# Patient Record
Sex: Male | Born: 1965 | Race: Black or African American | Hispanic: No | Marital: Married | Smoking: Never smoker
Health system: Southern US, Community
[De-identification: ages and names within clinical notes are randomized; demographics above are authoritative.]

## PROBLEM LIST (undated history)

## (undated) DIAGNOSIS — E785 Hyperlipidemia, unspecified: Secondary | ICD-10-CM

## (undated) DIAGNOSIS — E559 Vitamin D deficiency, unspecified: Secondary | ICD-10-CM

## (undated) DIAGNOSIS — G4730 Sleep apnea, unspecified: Secondary | ICD-10-CM

## (undated) HISTORY — DX: Hyperlipidemia, unspecified: E78.5

## (undated) HISTORY — PX: TOOTH EXTRACTION: SUR596

## (undated) HISTORY — DX: Vitamin D deficiency, unspecified: E55.9

## (undated) HISTORY — PX: NO PAST SURGERIES: SHX2092

---

## 2010-03-05 ENCOUNTER — Ambulatory Visit: Payer: Self-pay | Admitting: Family Medicine

## 2013-10-08 LAB — HEMOGLOBIN A1C: HEMOGLOBIN A1C: 5.6 % (ref 4.0–6.0)

## 2013-10-08 LAB — BASIC METABOLIC PANEL
BUN: 14 mg/dL (ref 4–21)
Creatinine: 1.2 mg/dL (ref 0.6–1.3)
GLUCOSE: 95 mg/dL
Potassium: 5 mmol/L (ref 3.4–5.3)
Sodium: 143 mmol/L (ref 137–147)

## 2013-10-08 LAB — HEPATIC FUNCTION PANEL
ALT: 25 U/L (ref 10–40)
AST: 23 U/L (ref 14–40)

## 2013-10-08 LAB — LIPID PANEL
Cholesterol: 185 mg/dL (ref 0–200)
HDL: 43 mg/dL (ref 35–70)
LDL CALC: 122 mg/dL
Triglycerides: 99 mg/dL (ref 40–160)

## 2013-11-24 ENCOUNTER — Ambulatory Visit: Payer: Self-pay | Admitting: Family Medicine

## 2015-05-30 ENCOUNTER — Other Ambulatory Visit: Payer: Self-pay | Admitting: Family Medicine

## 2015-05-30 DIAGNOSIS — E559 Vitamin D deficiency, unspecified: Secondary | ICD-10-CM

## 2015-05-30 NOTE — Telephone Encounter (Signed)
Refill request

## 2015-05-30 NOTE — Telephone Encounter (Signed)
Let him know I would like to get a Vitamin D level first. Lab order is complete.

## 2015-05-30 NOTE — Telephone Encounter (Signed)
Pt called needing refill on his Vit D.  He use to see Dolores Frame.   His call back is [phone].  He uses [facility].  tp

## 2015-05-31 NOTE — Telephone Encounter (Signed)
Left message to call back  

## 2015-05-31 NOTE — Telephone Encounter (Signed)
Pt returned call. Thanks TNP °

## 2015-06-07 NOTE — Telephone Encounter (Signed)
Left message to call back  

## 2015-06-12 NOTE — Telephone Encounter (Signed)
Patient has been advised that it is time to have lab checked prior to refill. He states that he is out of town and will return Friday and plans on getting labs done then

## 2015-08-09 ENCOUNTER — Ambulatory Visit (INDEPENDENT_AMBULATORY_CARE_PROVIDER_SITE_OTHER): Payer: Managed Care, Other (non HMO) | Admitting: Family Medicine

## 2015-08-09 ENCOUNTER — Encounter: Payer: Self-pay | Admitting: Family Medicine

## 2015-08-09 VITALS — BP 112/70 | HR 78 | Temp 97.7°F | Resp 16 | Ht 71.0 in | Wt 189.0 lb

## 2015-08-09 DIAGNOSIS — J309 Allergic rhinitis, unspecified: Secondary | ICD-10-CM | POA: Insufficient documentation

## 2015-08-09 DIAGNOSIS — E559 Vitamin D deficiency, unspecified: Secondary | ICD-10-CM | POA: Diagnosis not present

## 2015-08-09 DIAGNOSIS — M722 Plantar fascial fibromatosis: Secondary | ICD-10-CM

## 2015-08-09 DIAGNOSIS — E7800 Pure hypercholesterolemia, unspecified: Secondary | ICD-10-CM

## 2015-08-09 DIAGNOSIS — Z131 Encounter for screening for diabetes mellitus: Secondary | ICD-10-CM | POA: Diagnosis not present

## 2015-08-09 DIAGNOSIS — N529 Male erectile dysfunction, unspecified: Secondary | ICD-10-CM | POA: Insufficient documentation
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
## 2015-08-09 NOTE — Patient Instructions (Signed)
We will call you with lab results and with your podiatry referral

## 2015-08-15 ENCOUNTER — Telehealth: Payer: Self-pay

## 2015-08-15 LAB — COMPREHENSIVE METABOLIC PANEL
ALBUMIN: 4.8 g/dL (ref 3.5–5.5)
ALK PHOS: 78 IU/L (ref 39–117)
ALT: 17 IU/L (ref 0–44)
AST: 19 IU/L (ref 0–40)
Albumin/Globulin Ratio: 2 (ref 1.1–2.5)
BILIRUBIN TOTAL: 1.3 mg/dL — AB (ref 0.0–1.2)
BUN / CREAT RATIO: 11 (ref 9–20)
BUN: 17 mg/dL (ref 6–24)
CHLORIDE: 100 mmol/L (ref 97–108)
CO2: 26 mmol/L (ref 18–29)
Calcium: 9.8 mg/dL (ref 8.7–10.2)
Creatinine, Ser: 1.48 mg/dL — ABNORMAL HIGH (ref 0.76–1.27)
GFR calc Af Amer: 64 mL/min/{1.73_m2} (ref >59)
GFR calc non Af Amer: 56 mL/min/{1.73_m2} — ABNORMAL LOW (ref >59)
GLUCOSE: 103 mg/dL — AB (ref 65–99)
Globulin, Total: 2.4 g/dL (ref 1.5–4.5)
POTASSIUM: 5 mmol/L (ref 3.5–5.2)
SODIUM: 142 mmol/L (ref 134–144)
Total Protein: 7.2 g/dL (ref 6.0–8.5)

## 2015-08-15 LAB — LIPID PANEL
CHOLESTEROL TOTAL: 222 mg/dL — AB (ref 100–199)
Chol/HDL Ratio: 4.6 ratio units (ref 0.0–5.0)
HDL: 48 mg/dL (ref >39)
LDL Calculated: 152 mg/dL — ABNORMAL HIGH (ref 0–99)
Triglycerides: 111 mg/dL (ref 0–149)
VLDL CHOLESTEROL CAL: 22 mg/dL (ref 5–40)

## 2015-08-15 LAB — VITAMIN D 25 HYDROXY (VIT D DEFICIENCY, FRACTURES): Vit D, 25-Hydroxy: 20.4 ng/mL — ABNORMAL LOW (ref 30.0–100.0)

## 2015-08-15 NOTE — Telephone Encounter (Signed)
Patient was advised of lab report he states that he is concerned about his kidney function declining and wants to know what could be the cause of this?

## 2015-08-15 NOTE — Telephone Encounter (Signed)
-----   Message from Carmon Ginsberg, [place] sent at 08/15/2015  8:07 AM EDT ----- Your cholesterol is mildly elevated but your 10 year risk for developing cardiovascular disease is calculated to be low at 3.8%. Your kidney function has declined since prior labs and I would like to repeat them in 3 months. Vitamin D is mildly depleted and would respond to 800 units daily over the counter. Sugar is creeping up and will repeat that in 3 months as well. Encourage regular exercise 30 minutes + daily.

## 2015-08-15 NOTE — Telephone Encounter (Signed)
Patient has been advised. KW 

## 2015-08-15 NOTE — Telephone Encounter (Signed)
Kidney function declines with aging. Also on a given day decreased fluid intake or medication may play a part on reducing function. We repeat labs to see if there is a trend up, down, or no change in function.

## 2017-03-06 ENCOUNTER — Encounter: Payer: Self-pay | Admitting: Family Medicine

## 2017-03-06 ENCOUNTER — Ambulatory Visit (INDEPENDENT_AMBULATORY_CARE_PROVIDER_SITE_OTHER): Payer: Managed Care, Other (non HMO) | Admitting: Family Medicine

## 2017-03-06 VITALS — BP 120/82 | HR 60 | Temp 97.9°F | Resp 16 | Wt 200.0 lb

## 2017-03-06 DIAGNOSIS — E559 Vitamin D deficiency, unspecified: Secondary | ICD-10-CM

## 2017-03-06 DIAGNOSIS — E7800 Pure hypercholesterolemia, unspecified: Secondary | ICD-10-CM | POA: Diagnosis not present

## 2017-03-06 DIAGNOSIS — R51 Headache: Secondary | ICD-10-CM | POA: Diagnosis not present

## 2017-03-06 DIAGNOSIS — N529 Male erectile dysfunction, unspecified: Secondary | ICD-10-CM

## 2017-03-06 DIAGNOSIS — R0683 Snoring: Secondary | ICD-10-CM

## 2017-03-06 DIAGNOSIS — R519 Headache, unspecified: Secondary | ICD-10-CM

## 2017-03-06 NOTE — Patient Instructions (Signed)
We will call you with the lab work. Proceed with eye exam as planned. Return  Epworth screen when completed.

## 2017-03-06 NOTE — Progress Notes (Signed)
Subjective:     Patient ID: Douglas Coffey, male   DOB: 1966/01/02, 51 y.o.   MRN: [id number]  HPI  Chief Complaint  Patient presents with  . Headache    Patient comes in office today to address frequent headaches he has been having over the past several months, patient reports that he has been under stress but does not feel that it is related. Patient states that he takes otc Advil and Aleve for pain relief.   . Abnormal Lab    Patient would like to discuss getting his annual blood work screeening today.Last time labs were checked was 08/09/15, cholesterol was elevated and patients kidney funtion seemed to be declining. At patients last office visit it was advised that he follow up in 3 months for repeat labs, patient was also advised to start Vit D 800iu for vitamin d deficiency.   States  He has throbbing 7/10 headaches every other week for the last couple of months. No personal or family hx of migraines. Denies work stress. Usually will respond to otc nsaid's and/or a nap. States he looks at monitors during his workday and may not wear his glasses as often as he should. He is pending eye exam. Caffeine use is one cup/day. Reports low energy and sub-optimal erectile dysfunction though libido is intact. He has had plantar fasciitis and has not been exercising regularly with weight gain of 11# since 08/09/2015 o.v.   Review of Systems  Respiratory: Negative for shortness of breath.   Cardiovascular: Negative for chest pain and palpitations.  Genitourinary:       Nocturia x one       Objective:   Physical Exam  Constitutional: He appears well-developed. No distress.  Eyes: EOM are normal. Pupils are equal, round, and reactive to light.  Neck:  No carotid bruits  Cardiovascular: Normal rate and regular rhythm.   Pulmonary/Chest: Breath sounds normal.       Assessment:    1. Pure hypercholesterolemia - Lipid panel  2. Vitamin D deficiency - VITAMIN D 25 Hydroxy (Vit-D Deficiency,  Fractures)  3. Erectile dysfunction, unspecified erectile dysfunction type  4. Headache disorder - Comprehensive metabolic panel - T4, free - TSH  5. Snoring: provided with Epworth sreen    Plan:    Proceed with eye exam. Further f/u pending labwork and Epworth screen. Consider Viagra.

## 2017-03-07 ENCOUNTER — Other Ambulatory Visit: Payer: Self-pay | Admitting: Family Medicine

## 2017-03-07 LAB — LIPID PANEL
Chol/HDL Ratio: 4.8 ratio (ref 0.0–5.0)
Cholesterol, Total: 222 mg/dL — ABNORMAL HIGH (ref 100–199)
HDL: 46 mg/dL (ref >39)
LDL Calculated: 152 mg/dL — ABNORMAL HIGH (ref 0–99)
Triglycerides: 122 mg/dL (ref 0–149)
VLDL Cholesterol Cal: 24 mg/dL (ref 5–40)

## 2017-03-07 LAB — COMPREHENSIVE METABOLIC PANEL
A/G RATIO: 2 (ref 1.2–2.2)
ALBUMIN: 4.9 g/dL (ref 3.5–5.5)
ALT: 17 IU/L (ref 0–44)
AST: 18 IU/L (ref 0–40)
Alkaline Phosphatase: 72 IU/L (ref 39–117)
BILIRUBIN TOTAL: 1.1 mg/dL (ref 0.0–1.2)
BUN / CREAT RATIO: 13 (ref 9–20)
BUN: 17 mg/dL (ref 6–24)
CHLORIDE: 97 mmol/L (ref 96–106)
CO2: 26 mmol/L (ref 18–29)
Calcium: 9.1 mg/dL (ref 8.7–10.2)
Creatinine, Ser: 1.34 mg/dL — ABNORMAL HIGH (ref 0.76–1.27)
GFR calc non Af Amer: 62 mL/min/{1.73_m2} (ref >59)
GFR, EST AFRICAN AMERICAN: 72 mL/min/{1.73_m2} (ref >59)
GLOBULIN, TOTAL: 2.5 g/dL (ref 1.5–4.5)
GLUCOSE: 76 mg/dL (ref 65–99)
POTASSIUM: 4.7 mmol/L (ref 3.5–5.2)
SODIUM: 142 mmol/L (ref 134–144)
Total Protein: 7.4 g/dL (ref 6.0–8.5)

## 2017-03-07 LAB — T4, FREE: FREE T4: 1.27 ng/dL (ref 0.82–1.77)

## 2017-03-07 LAB — TSH: TSH: 3.9 u[IU]/mL (ref 0.450–4.500)

## 2017-03-07 LAB — VITAMIN D 25 HYDROXY (VIT D DEFICIENCY, FRACTURES): VIT D 25 HYDROXY: 30.7 ng/mL (ref 30.0–100.0)

## 2017-03-07 MED ORDER — SILDENAFIL CITRATE 50 MG PO TABS: 50.0000 mg | ORAL_TABLET | Freq: Every day | ORAL | 5 refills | Status: DC | PRN

## 2017-06-03 ENCOUNTER — Encounter: Payer: Self-pay | Admitting: Family Medicine

## 2017-06-03 ENCOUNTER — Ambulatory Visit (INDEPENDENT_AMBULATORY_CARE_PROVIDER_SITE_OTHER): Payer: Managed Care, Other (non HMO) | Admitting: Family Medicine

## 2017-06-03 VITALS — BP 110/80 | HR 74 | Temp 98.6°F | Resp 16 | Wt 200.8 lb

## 2017-06-03 DIAGNOSIS — R0683 Snoring: Secondary | ICD-10-CM

## 2017-06-03 DIAGNOSIS — R040 Epistaxis: Secondary | ICD-10-CM | POA: Diagnosis not present

## 2017-06-03 DIAGNOSIS — G43009 Migraine without aura, not intractable, without status migrainosus: Secondary | ICD-10-CM | POA: Diagnosis not present

## 2017-06-03 MED ORDER — NORTRIPTYLINE HCL 10 MG PO CAPS: ORAL_CAPSULE | ORAL | 0 refills | Status: DC

## 2017-06-03 NOTE — Patient Instructions (Signed)
Please complete Epworth/sleep apnea screen and return. Try daily use of saline nasal spray. After you have a nose bleed use Afrin or similar once after a nose bleed. If not improving call for ENT consult. Migraine Headache A migraine headache is a very strong throbbing pain on one side or both sides of your head. Migraines can also cause other symptoms. Talk with your doctor about what things may bring on (trigger) your migraine headaches. Follow these instructions at home: Medicines  Take over-the-counter and prescription medicines only as told by your doctor.  Do not drive or use heavy machinery while taking prescription pain medicine.  To prevent or treat constipation while you are taking prescription pain medicine, your doctor may recommend that you: ? Drink enough fluid to keep your pee (urine) clear or pale yellow. ? Take over-the-counter or prescription medicines. ? Eat foods that are high in fiber. These include fresh fruits and vegetables, whole grains, and beans. ? Limit foods that are high in fat and processed sugars. These include fried and sweet foods. Lifestyle  Avoid alcohol.  Do not use any products that contain nicotine or tobacco, such as cigarettes and e-cigarettes. If you need help quitting, ask your doctor.  Get at least 8 hours of sleep every night.  Limit your stress. General instructions   Keep a journal to find out what may bring on your migraines. For example, write down: ? What you eat and drink. ? How much sleep you get. ? Any change in what you eat or drink. ? Any change in your medicines.  If you have a migraine: ? Avoid things that make your symptoms worse, such as bright lights. ? It may help to lie down in a dark, quiet room. ? Do not drive or use heavy machinery. ? Ask your doctor what activities are safe for you.  Keep all follow-up visits as told by your doctor. This is important. Contact a doctor if:  You get a migraine that is different  or worse than your usual migraines. Get help right away if:  Your migraine gets very bad.  You have a fever.  You have a stiff neck.  You have trouble seeing.  Your muscles feel weak or like you cannot control them.  You start to lose your balance a lot.  You start to have trouble walking.  You pass out (faint). This information is not intended to replace advice given to you by your health care provider. Make sure you discuss any questions you have with your health care provider. Document Released: 08/27/2008 Document Revised: 06/07/2016 Document Reviewed: 05/06/2016 Elsevier Interactive Patient Education  2017 [employer].

## 2017-06-03 NOTE — Progress Notes (Signed)
Subjective:     Patient ID: Douglas Coffey, male   DOB: [birth year], 51 y.o.   MRN: [id number]  HPI  Chief Complaint  Patient presents with  . Headache    Patient comes in office today with complaints of intermittent headaches for the past 3 weeks. Patient states that he has pain on the top sides of his head that he describes as a throbbing feeling. Patient states that he takes Tylenol or Advil when at work when headaches occurs.   . Epistaxis    Patient reports episodes of frequent nose bleeds over the past few weeks. Patient states that he bleeds from the right notstril and usual packs it with tissue and bleeding subsides in a matter of seconds.   Seen previously for headaches in April. He updated his eye exam at that time with change of prescription. States he is getting 3-4/week and will wake up with them at times. Describes as throbbing with mild photophobia, pain intensity up to 8-9/10. Denies work or marital stress and is unsure what triggers them. He is currently undergoing chiropractic treatment for a "pinched nerve" in his neck. Localizes pain to the posterior base of his neck. Regarding nose bleeds states he does work in his yard and is exposed to heat and dust. Reports either nostril can bleed. No other bleeding sites reported.   Review of Systems  Respiratory:       Will have him repeat he Epworth screen due to headaches. Prior score in April of 11.       Objective:   Physical Exam  Constitutional: He appears well-developed and well-nourished. No distress.  HENT:  Nostrils without bleeding sites or polyps noted.  Eyes: EOM are normal. Pupils are equal, round, and reactive to light.  Musculoskeletal:  Cervical FROM with increased posterior neck pain with flexion and right lateral movement  Neurological: Coordination (Finger to Nose WNL, Romberg negative) normal.       Assessment:    1. Epistaxis, recurrent  2. Migraine without aura and without status migrainosus, not  intractable - nortriptyline (PAMELOR) 10 MG capsule; One to three at bedtime  Dispense: 30 capsule; Refill: 0  3. Snoring: provided with Epworth screen    Plan:    Discussed use of saline spray for humidification and nasal decongestant spray immediately after a nose bleed. He will return Epworth screen and let me know if nortriptyline tolerated.

## 2017-06-05 ENCOUNTER — Other Ambulatory Visit: Payer: Self-pay | Admitting: Family Medicine

## 2017-06-05 DIAGNOSIS — R0683 Snoring: Secondary | ICD-10-CM

## 2017-06-10 ENCOUNTER — Encounter: Payer: Self-pay | Admitting: Family Medicine

## 2017-10-02 ENCOUNTER — Telehealth: Payer: Self-pay | Admitting: Family Medicine

## 2017-10-02 NOTE — Telephone Encounter (Signed)
Pt states insurance will only cover an at home sleep study.  Pt is requesting a referral to have his sleep study done at home.  UK#[phone]/[id number]

## 2017-10-03 NOTE — Telephone Encounter (Signed)
Douglas Coffey, you should have received a request for home sleep study.Let me know what else I need to do

## 2017-10-03 NOTE — Telephone Encounter (Signed)
Please review referral.KW

## 2017-10-07 NOTE — Telephone Encounter (Signed)
Pt returned call and was advised that order was faxed. Thanks TNP

## 2017-10-07 NOTE — Telephone Encounter (Signed)
Order for home sleep study faxed to [facility].Marland KitchenLeft message or pt to return call to advise

## 2017-10-24 ENCOUNTER — Other Ambulatory Visit: Payer: Self-pay | Admitting: Family Medicine

## 2017-10-24 DIAGNOSIS — G4733 Obstructive sleep apnea (adult) (pediatric): Secondary | ICD-10-CM

## 2017-10-29 ENCOUNTER — Encounter: Payer: Self-pay | Admitting: Family Medicine

## 2017-12-25 ENCOUNTER — Encounter: Payer: Self-pay | Admitting: Family Medicine

## 2017-12-25 ENCOUNTER — Ambulatory Visit: Payer: Managed Care, Other (non HMO) | Admitting: Family Medicine

## 2017-12-25 VITALS — BP 110/80 | HR 71 | Temp 97.9°F | Resp 16 | Wt 206.4 lb

## 2017-12-25 DIAGNOSIS — G4733 Obstructive sleep apnea (adult) (pediatric): Secondary | ICD-10-CM | POA: Insufficient documentation

## 2017-12-25 DIAGNOSIS — J0100 Acute maxillary sinusitis, unspecified: Secondary | ICD-10-CM

## 2017-12-25 MED ORDER — AMOXICILLIN-POT CLAVULANATE 875-125 MG PO TABS: 1.0000 | ORAL_TABLET | Freq: Two times a day (BID) | ORAL | 0 refills | Status: DC

## 2017-12-25 NOTE — Patient Instructions (Signed)
Discussed use of Mucinex and Sudafed for congestion, Delsym for cough, and Benadryl for postnasal drainage. Before flying use a nasal decongestant spray like Afrin.

## 2017-12-25 NOTE — Progress Notes (Signed)
Subjective:     Patient ID: Douglas Coffey, male   DOB: [birth year], 52 y.o.   MRN: [id number] Chief Complaint  Patient presents with  . Sinus Problem    Patient comes in office today with concerns of sinus pain and pressure for one week. Patient reports the following; cough, sore throat, sneezing and watery eyes. Patient has tried otc Theraflu, Sudafed and Mucinex.    HPI Patient reports increased sinus pressure, purulent sinus drainage, post nasal drainage and accompanying cough. States he is [place] for [place] in two days.  Review of Systems     Objective:   Physical Exam  Constitutional: He appears well-developed and well-nourished. No distress.  Ears: T.M's intact without inflammation Sinuses: mild maxillary sinus tenderness Throat: no tonsillar enlargement or exudate Neck: no cervical adenopathy Lungs: clear     Assessment:    1. Acute maxillary sinusitis, recurrence not specified - amoxicillin-clavulanate (AUGMENTIN) 875-125 MG tablet; Take 1 tablet by mouth 2 (two) times daily.  Dispense: 20 tablet; Refill: 0    Plan:    Discussed otc medication and use of nasal decongestant spray before flying.

## 2018-01-05 ENCOUNTER — Other Ambulatory Visit: Payer: Self-pay | Admitting: Family Medicine

## 2018-01-05 DIAGNOSIS — G4733 Obstructive sleep apnea (adult) (pediatric): Secondary | ICD-10-CM

## 2019-01-07 ENCOUNTER — Ambulatory Visit: Payer: Managed Care, Other (non HMO) | Admitting: Family Medicine

## 2019-01-07 ENCOUNTER — Encounter: Payer: Self-pay | Admitting: Family Medicine

## 2019-01-07 ENCOUNTER — Other Ambulatory Visit: Payer: Self-pay | Admitting: Family Medicine

## 2019-01-07 ENCOUNTER — Other Ambulatory Visit: Payer: Self-pay

## 2019-01-07 VITALS — BP 114/80 | HR 67 | Temp 97.7°F | Ht 71.0 in | Wt 208.0 lb

## 2019-01-07 DIAGNOSIS — N529 Male erectile dysfunction, unspecified: Secondary | ICD-10-CM

## 2019-01-07 DIAGNOSIS — E7800 Pure hypercholesterolemia, unspecified: Secondary | ICD-10-CM

## 2019-01-07 DIAGNOSIS — E559 Vitamin D deficiency, unspecified: Secondary | ICD-10-CM

## 2019-01-07 DIAGNOSIS — Z131 Encounter for screening for diabetes mellitus: Secondary | ICD-10-CM

## 2019-01-07 DIAGNOSIS — Z1211 Encounter for screening for malignant neoplasm of colon: Secondary | ICD-10-CM

## 2019-01-07 MED ORDER — SILDENAFIL CITRATE 100 MG PO TABS: 100.0000 mg | ORAL_TABLET | Freq: Every day | ORAL | 2 refills | Status: DC | PRN

## 2019-01-07 NOTE — Progress Notes (Signed)
  Subjective:     Patient ID: Douglas Coffey, male   DOB: [birth year], 53 y.o.   MRN: [id number] Chief Complaint  Patient presents with  . Erectile Dysfunction    labs requested for PSA, and other labs  . referal request    urology   HPI States he has had friends diagnosed with prostate cancer. Also states he never filled prior rx for Viagra due to cost at that time. States if Viagra helps his erections and stamina he won't need a urology referral. Is willing to get other age appropriate screening. Remains on C-Pap for OSA.  Review of Systems  Respiratory: Negative for shortness of breath.   Cardiovascular: Negative for chest pain and palpitations.  Genitourinary:       Nocturia 0-1       Objective:   Physical Exam Constitutional:      General: He is not in acute distress. Cardiovascular:     Rate and Rhythm: Normal rate and regular rhythm.  Pulmonary:     Breath sounds: Normal breath sounds.  Musculoskeletal:     Right lower leg: No edema.     Left lower leg: No edema.  Neurological:     Mental Status: He is alert.        Assessment:    1. Erectile dysfunction, unspecified erectile dysfunction type - PSA  2. Pure hypercholesterolemia - Lipid panel  3. Screening for diabetes mellitus - Comprehensive metabolic panel  4. Vitamin D deficiency - VITAMIN D 25 Hydroxy (Vit-D Deficiency, Fractures)  5. Screen for colon cancer - Ambulatory referral to Gastroenterology    Plan:    Further f/u pending lab work. He will let us know about efficacy of the Viagra.

## 2019-01-07 NOTE — Patient Instructions (Signed)
We will call you with the lab results. 

## 2019-01-08 LAB — LIPID PANEL
Chol/HDL Ratio: 5.1 ratio — ABNORMAL HIGH (ref 0.0–5.0)
Cholesterol, Total: 235 mg/dL — ABNORMAL HIGH (ref 100–199)
HDL: 46 mg/dL (ref >39)
LDL Calculated: 166 mg/dL — ABNORMAL HIGH (ref 0–99)
Triglycerides: 114 mg/dL (ref 0–149)
VLDL Cholesterol Cal: 23 mg/dL (ref 5–40)

## 2019-01-08 LAB — COMPREHENSIVE METABOLIC PANEL
ALBUMIN: 4.6 g/dL (ref 4.0–5.0)
ALT: 22 IU/L (ref 0–44)
AST: 18 IU/L (ref 0–40)
Albumin/Globulin Ratio: 2 (ref 1.2–2.2)
Alkaline Phosphatase: 77 IU/L (ref 39–117)
BUN/Creatinine Ratio: 10 (ref 9–20)
BUN: 13 mg/dL (ref 6–24)
Bilirubin Total: 1 mg/dL (ref 0.0–1.2)
CO2: 26 mmol/L (ref 20–29)
Calcium: 9.3 mg/dL (ref 8.7–10.2)
Chloride: 102 mmol/L (ref 96–106)
Creatinine, Ser: 1.28 mg/dL — ABNORMAL HIGH (ref 0.76–1.27)
GFR calc Af Amer: 75 mL/min/{1.73_m2} (ref >59)
GFR calc non Af Amer: 65 mL/min/{1.73_m2} (ref >59)
GLOBULIN, TOTAL: 2.3 g/dL (ref 1.5–4.5)
Glucose: 84 mg/dL (ref 65–99)
Potassium: 4.7 mmol/L (ref 3.5–5.2)
SODIUM: 144 mmol/L (ref 134–144)
Total Protein: 6.9 g/dL (ref 6.0–8.5)

## 2019-01-08 LAB — VITAMIN D 25 HYDROXY (VIT D DEFICIENCY, FRACTURES): Vit D, 25-Hydroxy: 23.6 ng/mL — ABNORMAL LOW (ref 30.0–100.0)

## 2019-01-08 LAB — PSA: Prostate Specific Ag, Serum: 0.9 ng/mL (ref 0.0–4.0)

## 2019-01-18 ENCOUNTER — Encounter: Payer: Self-pay | Admitting: *Deleted

## 2019-09-08 ENCOUNTER — Other Ambulatory Visit: Payer: Self-pay

## 2019-09-08 ENCOUNTER — Ambulatory Visit: Payer: Managed Care, Other (non HMO) | Admitting: Physician Assistant

## 2019-09-08 ENCOUNTER — Encounter: Payer: Self-pay | Admitting: Physician Assistant

## 2019-09-08 VITALS — BP 125/87 | HR 74 | Temp 97.2°F | Resp 16 | Ht 71.0 in | Wt 197.2 lb

## 2019-09-08 DIAGNOSIS — N529 Male erectile dysfunction, unspecified: Secondary | ICD-10-CM

## 2019-09-08 DIAGNOSIS — Z1211 Encounter for screening for malignant neoplasm of colon: Secondary | ICD-10-CM | POA: Diagnosis not present

## 2019-09-08 DIAGNOSIS — R251 Tremor, unspecified: Secondary | ICD-10-CM | POA: Diagnosis not present

## 2019-09-08 NOTE — Patient Instructions (Signed)

## 2019-09-08 NOTE — Progress Notes (Signed)
Patient: Douglas Coffey Male    DOB: May 31, 1966   53 y.o.   MRN: [id number] Visit Date: 09/08/2019  Today's Provider: Trinna Post, PA-C   Chief Complaint  Patient presents with  . hand  shaking   Subjective:    I,Joseline E. Rosas,RMA am acting as a Education administrator for [employer], PA-C.  HPI  Patient here with c/o noticed when eating his hand shaking a little, right hand. He has noticed this for a couple months. Doesn't happen all the time, happens sporadically. Worse when he eats. Drinks decaffeinated tea. Takes supplements including gingko biloba, tumeric, cinnamon, L-carnitine, multivitamin, ashwadhanda, vitamin B12 and vitamin D. Wondering if any of these could interfere.   Referred for colonoscopy 01/2019. Has not yet scheduled due to [employer].   Patient taking Viagra for erectile dysfunction. Wondering if there is any cheaper alternative.    Patient Declined Influenza vaccine.  No Known Allergies   Current Outpatient Medications:  .  cetirizine (ZYRTEC) 10 MG tablet, Take 10 mg by mouth daily., Disp: , Rfl:  .  Multiple Vitamin (MULTIVITAMIN) tablet, Take 1 tablet by mouth daily., Disp: , Rfl:  .  Omega-3 Fatty Acids (FISH OIL) 1200 MG CAPS, Take 2 capsules by mouth daily., Disp: , Rfl:  .  psyllium (METAMUCIL) 58.6 % powder, Take 1 packet by mouth 2 (two) times daily., Disp: , Rfl:  .  sildenafil (VIAGRA) 100 MG tablet, Take 1 tablet (100 mg total) by mouth daily as needed for erectile dysfunction., Disp: 10 tablet, Rfl: 2 .  TURMERIC PO, Take by mouth., Disp: , Rfl:  .  montelukast (SINGULAIR) 10 MG tablet, Take 10 mg by mouth at bedtime., Disp: , Rfl:  .  nortriptyline (PAMELOR) 10 MG capsule, One to three at bedtime (Patient not taking: Reported on 01/07/2019), Disp: 30 capsule, Rfl: 0  Review of Systems  Social History   Tobacco Use  . Smoking status: Never Smoker  . Smokeless tobacco: Never Used  Substance Use Topics  . Alcohol use: No    Alcohol/week: 0.0  standard drinks      Objective:   BP 125/87 (BP Location: Left Arm, Patient Position: Sitting, Cuff Size: Large)   Pulse 74   Temp (!) 97.2 F (36.2 C) (Other (Comment))   Resp 16   Ht 5\' 11"  (1.803 m)   Wt 197 lb 3.2 oz (89.4 kg)   BMI 27.50 kg/m  Vitals:   09/08/19 0947  BP: 125/87  Pulse: 74  Resp: 16  Temp: (!) 97.2 F (36.2 C)  TempSrc: Other (Comment)  Weight: 197 lb 3.2 oz (89.4 kg)  Height: 5\' 11"  (1.803 m)  Body mass index is 27.5 kg/m.   Physical Exam Constitutional:      Appearance: Normal appearance.  Eyes:     Extraocular Movements: Extraocular movements intact.     Pupils: Pupils are equal, round, and reactive to light.  Skin:    General: Skin is warm and dry.  Neurological:     Mental Status: He is alert and oriented to person, place, and time. Mental status is at baseline.     Motor: Tremor present.     Comments: Slight tremor in hands bilaterally, right > left, when held against gravity. Slightly worse with goal directed activity. 5/5 bilateral upper extremity strength.   Psychiatric:        Mood and Affect: Mood normal.        Behavior: Behavior normal.  No results found for any visits on 09/08/19.     Assessment & Plan    1. Tremor  Symptoms consistent with essential tremor. Mild tremor on exam today. Reviewed that it may progress. Reviewed treatments including propranolol. He does not want to start treatment today as he feels symptoms are mild but he may consider this in the future. Counseled on risks of some of his supplements including bleeding with gingko biloba, decreased iron absorption with tumeric and hyperthryoidism with ashwaghanda.   2. Colon cancer screening  Encouraged him to schedule colonoscopy with [facility].   3. Erectile dysfunction, unspecified erectile dysfunction type  Counseled on calling pharmacies to compare prices. Counseled on [facility] mail order pharmacy in [place].   The entirety of the  information documented in the History of Present Illness, Review of Systems and Physical Exam were personally obtained by me. Portions of this information were initially documented by Lyndel Pleasure, CMA and reviewed by me for thoroughness and accuracy.   F/u PRN        Trinna Post, PA-C  [facility]

## 2020-02-11 ENCOUNTER — Ambulatory Visit: Payer: Self-pay | Attending: Internal Medicine

## 2020-02-11 DIAGNOSIS — Z23 Encounter for immunization: Secondary | ICD-10-CM

## 2020-02-11 NOTE — Progress Notes (Signed)
   Covid-19 Vaccination Clinic  Name:  Douglas Coffey    MRN: [id number] DOB: 1966-06-27  02/11/2020  [name] was observed post Covid-19 immunization for 15 minutes without incident. He was provided with Vaccine Information Sheet and instruction to access the V-Safe system.   [name] was instructed to call 911 with any severe reactions post vaccine: Marland Kitchen Difficulty breathing  . Swelling of face and throat  . A fast heartbeat  . A bad rash all over body  . Dizziness and weakness   Immunizations Administered    Name Date Dose VIS Date Route   Moderna COVID-19 Vaccine 02/11/2020 11:37 AM 0.5 mL 11/02/2019 Intramuscular   Manufacturer: Moderna   Lot: [id number]   [id number]

## 2020-03-14 ENCOUNTER — Ambulatory Visit: Payer: Self-pay | Attending: Internal Medicine

## 2020-03-14 DIAGNOSIS — Z23 Encounter for immunization: Secondary | ICD-10-CM

## 2020-03-14 NOTE — Progress Notes (Signed)
   Covid-19 Vaccination Clinic  Name:  Percie Campisano    MRN: [id number] DOB: 1966-03-08  03/14/2020  Mr. Thalheimer was observed post Covid-19 immunization for 15 minutes without incident. He was provided with Vaccine Information Sheet and instruction to access the V-Safe system.   Mr. Simich was instructed to call 911 with any severe reactions post vaccine: Marland Kitchen Difficulty breathing  . Swelling of face and throat  . A fast heartbeat  . A bad rash all over body  . Dizziness and weakness   Immunizations Administered    Name Date Dose VIS Date Route   Moderna COVID-19 Vaccine 03/14/2020 11:28 AM 0.5 mL 11/02/2019 Intramuscular   Manufacturer: Moderna   Lot: [id number]   [id number]

## 2020-05-17 NOTE — Progress Notes (Signed)
Established patient visit   Patient: Douglas Coffey   DOB: 12/20/1965   54 y.o. Male  MRN: [id number] Visit Date: 05/18/2020  Today's healthcare provider: Mar Daring, PA-C   Chief Complaint  Patient presents with  . Tremors  . Tinnitus  . Erectile Dysfunction   Subjective    HPI Follow up for Tremors  The patient was last seen for this 6 months ago. Changes made at last visit include no changes.  He reports no oral medications. Patient reports tremor is more noticeable on right hand. He feels that condition is Worse. ----------------------------------------------------------------------------------------- Follow up for erectile dysfunction  The patient was last seen for this 6 months ago. Changes made at last visit include no changes.  He reports excellent compliance with treatment. He feels that condition is Unchanged. He is not having side effects. Patient is requesting to get more pills with refill. Patient reports that #10 is not enough for him.  ----------------------------------------------------------------------------------------- Patient C/O ringing in his ears. Patient reports he was seen at ENT yesterday, and was advised to be seen with neurology. Patient reports that ENT also recommend patient to get an MRI. Had hearing test that was normal.    Patient Active Problem List   Diagnosis Date Noted  . OSA (obstructive sleep apnea) 12/25/2017  . Vitamin D deficiency 08/09/2015  . Pure hypercholesterolemia 08/09/2015  . Allergic rhinitis 08/09/2015  . Erectile dysfunction 08/09/2015   Social History   Tobacco Use  . Smoking status: Never Smoker  . Smokeless tobacco: Never Used  Substance Use Topics  . Alcohol use: No    Alcohol/week: 0.0 standard drinks  . Drug use: No       Medications: Outpatient Medications Prior to Visit  Medication Sig  . cetirizine (ZYRTEC) 10 MG tablet Take 10 mg by mouth daily.  . montelukast (SINGULAIR) 10 MG  tablet Take 10 mg by mouth at bedtime.  . Multiple Vitamin (MULTIVITAMIN) tablet Take 1 tablet by mouth daily.  . Omega-3 Fatty Acids (FISH OIL) 1200 MG CAPS Take 2 capsules by mouth daily.  . psyllium (METAMUCIL) 58.6 % powder Take 1 packet by mouth 2 (two) times daily.  . sildenafil (VIAGRA) 100 MG tablet Take 1 tablet (100 mg total) by mouth daily as needed for erectile dysfunction.  . TURMERIC PO Take by mouth.  . [DISCONTINUED] nortriptyline (PAMELOR) 10 MG capsule One to three at bedtime (Patient not taking: Reported on 01/07/2019)   No facility-administered medications prior to visit.    Review of Systems  Constitutional: Negative.   HENT: Positive for tinnitus.   Respiratory: Negative.   Cardiovascular: Negative.   Neurological: Positive for tremors (right hand).    Last CBC Lab Results  Component Value Date   WBC 4.2 05/19/2020   HGB 15.2 05/19/2020   HCT 45.6 05/19/2020   MCV 90 05/19/2020   MCH 30.0 05/19/2020   RDW 12.7 05/19/2020   PLT 151 96/78/9381   Last metabolic panel Lab Results  Component Value Date   GLUCOSE 102 (H) 05/19/2020   NA 142 05/19/2020   K 4.9 05/19/2020   CL 103 05/19/2020   CO2 28 05/19/2020   BUN 15 05/19/2020   CREATININE 1.28 (H) 05/19/2020   GFRNONAA 64 05/19/2020   GFRAA 74 05/19/2020   CALCIUM 9.4 05/19/2020   PROT 6.9 05/19/2020   ALBUMIN 4.5 05/19/2020   LABGLOB 2.4 05/19/2020   AGRATIO 1.9 05/19/2020   BILITOT 0.9 05/19/2020   ALKPHOS 82 05/19/2020  AST 19 05/19/2020   ALT 19 05/19/2020   Last lipids Lab Results  Component Value Date   CHOL 194 05/19/2020   HDL 40 05/19/2020   LDLCALC 132 (H) 05/19/2020   TRIG 120 05/19/2020   CHOLHDL 5.1 (H) 01/07/2019      Objective    BP 115/77 (BP Location: Left Arm, Patient Position: Sitting, Cuff Size: Large)   Pulse 60   Temp (!) 97.1 F (36.2 C) (Temporal)   Resp 16   Ht 5\' 11"  (1.803 m)   Wt 205 lb (93 kg)   BMI 28.59 kg/m  BP Readings from Last 3 Encounters:    05/18/20 115/77  09/08/19 125/87  01/07/19 114/80   Wt Readings from Last 3 Encounters:  05/18/20 205 lb (93 kg)  09/08/19 197 lb 3.2 oz (89.4 kg)  01/07/19 208 lb (94.3 kg)      Physical Exam Vitals reviewed.  Constitutional:      General: He is not in acute distress.    Appearance: Normal appearance. He is well-developed, well-groomed and overweight. He is not diaphoretic.  HENT:     Head: Normocephalic and atraumatic.  Eyes:     General: No scleral icterus. Neck:     Thyroid: No thyromegaly.     Vascular: No carotid bruit or JVD.     Trachea: No tracheal deviation.  Cardiovascular:     Rate and Rhythm: Normal rate and regular rhythm.     Pulses: Normal pulses.     Heart sounds: Normal heart sounds. No murmur heard.  No friction rub. No gallop.   Pulmonary:     Effort: Pulmonary effort is normal. No respiratory distress.     Breath sounds: Normal breath sounds. No wheezing or rales.  Musculoskeletal:     Cervical back: Normal range of motion and neck supple.  Lymphadenopathy:     Cervical: No cervical adenopathy.  Neurological:     General: No focal deficit present.     Mental Status: He is alert. Mental status is at baseline.     Comments: Tremor noted in right hand only with intention; none at rest  Psychiatric:        Mood and Affect: Mood normal.        Behavior: Behavior normal. Behavior is cooperative.        Thought Content: Thought content normal.        Judgment: Judgment normal.       No results found for any visits on 05/18/20.  Assessment & Plan     1. Tremor Suspect benign essential tremor, but with tinnitus will refer to Neuro as bellow. Will check labs as below and f/u pending results. - Ambulatory referral to Neurology - CBC w/Diff/Platelet - Comprehensive Metabolic Panel (CMET)  2. Erectile dysfunction, unspecified erectile dysfunction type Stable on Viagra. Pays out of pocket. Refill supplied. - sildenafil (VIAGRA) 100 MG tablet; Take  1 tablet (100 mg total) by mouth daily as needed for erectile dysfunction.  Dispense: 30 tablet; Refill: 5  3. Colon cancer screening Due for colonoscopy. Referral placed.  - Ambulatory referral to Gastroenterology  4. Tinnitus of both ears Seen ENT and hearing test normal. Neurology referral placed.  - Ambulatory referral to Neurology  5. Pure hypercholesterolemia Diet controlled. Will check labs as below and f/u pending results. - CBC w/Diff/Platelet - Comprehensive Metabolic Panel (CMET) - Lipid Panel With LDL/HDL Ratio - HgB A1c  6. BMI 28.0-28.9,adult Counseled patient on healthy lifestyle modifications including dieting and  exercise.  Will check labs as below and f/u pending results. - CBC w/Diff/Platelet - Comprehensive Metabolic Panel (CMET) - Lipid Panel With LDL/HDL Ratio - HgB A1c  7. Encounter for hepatitis C screening test for low risk patient Will check labs as below and f/u pending results. - Hepatitis c antibody (reflex)  8. Screening for HIV without presence of risk factors Will check labs as below and f/u pending results. - HIV antibody (with reflex)  9. Screening for diabetes mellitus Will check labs as below and f/u pending results. - HgB A1c  10. Prostate cancer screening No urinary symptoms. Will check labs as below and f/u pending results. - PSA  11. Thyroid disorder screen Will check labs as below and f/u pending results. - TSH   No follow-ups on file.      Reynolds Bowl, PA-C, have reviewed all documentation for this visit. The documentation on 05/21/20 for the exam, diagnosis, procedures, and orders are all accurate and complete.   Rubye Beach  [facility] [phone] (phone) [phone] (fax)  [place]

## 2020-05-18 ENCOUNTER — Other Ambulatory Visit: Payer: Self-pay

## 2020-05-18 ENCOUNTER — Ambulatory Visit: Payer: Managed Care, Other (non HMO) | Admitting: Physician Assistant

## 2020-05-18 ENCOUNTER — Encounter: Payer: Self-pay | Admitting: Physician Assistant

## 2020-05-18 VITALS — BP 115/77 | HR 60 | Temp 97.1°F | Resp 16 | Ht 71.0 in | Wt 205.0 lb

## 2020-05-18 DIAGNOSIS — Z114 Encounter for screening for human immunodeficiency virus [HIV]: Secondary | ICD-10-CM

## 2020-05-18 DIAGNOSIS — H9313 Tinnitus, bilateral: Secondary | ICD-10-CM

## 2020-05-18 DIAGNOSIS — N529 Male erectile dysfunction, unspecified: Secondary | ICD-10-CM | POA: Diagnosis not present

## 2020-05-18 DIAGNOSIS — Z131 Encounter for screening for diabetes mellitus: Secondary | ICD-10-CM

## 2020-05-18 DIAGNOSIS — E7800 Pure hypercholesterolemia, unspecified: Secondary | ICD-10-CM

## 2020-05-18 DIAGNOSIS — Z1211 Encounter for screening for malignant neoplasm of colon: Secondary | ICD-10-CM | POA: Diagnosis not present

## 2020-05-18 DIAGNOSIS — Z1159 Encounter for screening for other viral diseases: Secondary | ICD-10-CM

## 2020-05-18 DIAGNOSIS — R251 Tremor, unspecified: Secondary | ICD-10-CM | POA: Diagnosis not present

## 2020-05-18 DIAGNOSIS — Z1329 Encounter for screening for other suspected endocrine disorder: Secondary | ICD-10-CM

## 2020-05-18 DIAGNOSIS — Z6828 Body mass index (BMI) 28.0-28.9, adult: Secondary | ICD-10-CM

## 2020-05-18 DIAGNOSIS — Z125 Encounter for screening for malignant neoplasm of prostate: Secondary | ICD-10-CM

## 2020-05-18 MED ORDER — SILDENAFIL CITRATE 100 MG PO TABS: 100.0000 mg | ORAL_TABLET | Freq: Every day | ORAL | 5 refills | Status: DC | PRN

## 2020-05-18 NOTE — Patient Instructions (Signed)

## 2020-05-20 LAB — CBC WITH DIFFERENTIAL/PLATELET
Basophils Absolute: 0 10*3/uL (ref 0.0–0.2)
Basos: 0 %
EOS (ABSOLUTE): 0.1 10*3/uL (ref 0.0–0.4)
Eos: 3 %
Hematocrit: 45.6 % (ref 37.5–51.0)
Hemoglobin: 15.2 g/dL (ref 13.0–17.7)
Immature Grans (Abs): 0 10*3/uL (ref 0.0–0.1)
Immature Granulocytes: 0 %
Lymphocytes Absolute: 1.8 10*3/uL (ref 0.7–3.1)
Lymphs: 44 %
MCH: 30 pg (ref 26.6–33.0)
MCHC: 33.3 g/dL (ref 31.5–35.7)
MCV: 90 fL (ref 79–97)
Monocytes Absolute: 0.4 10*3/uL (ref 0.1–0.9)
Monocytes: 8 %
Neutrophils Absolute: 1.9 10*3/uL (ref 1.4–7.0)
Neutrophils: 45 %
Platelets: 151 10*3/uL (ref 150–450)
RBC: 5.07 x10E6/uL (ref 4.14–5.80)
RDW: 12.7 % (ref 11.6–15.4)
WBC: 4.2 10*3/uL (ref 3.4–10.8)

## 2020-05-20 LAB — COMPREHENSIVE METABOLIC PANEL
ALT: 19 IU/L (ref 0–44)
AST: 19 IU/L (ref 0–40)
Albumin/Globulin Ratio: 1.9 (ref 1.2–2.2)
Albumin: 4.5 g/dL (ref 3.8–4.9)
Alkaline Phosphatase: 82 IU/L (ref 48–121)
BUN/Creatinine Ratio: 12 (ref 9–20)
BUN: 15 mg/dL (ref 6–24)
Bilirubin Total: 0.9 mg/dL (ref 0.0–1.2)
CO2: 28 mmol/L (ref 20–29)
Calcium: 9.4 mg/dL (ref 8.7–10.2)
Chloride: 103 mmol/L (ref 96–106)
Creatinine, Ser: 1.28 mg/dL — ABNORMAL HIGH (ref 0.76–1.27)
GFR calc Af Amer: 74 mL/min/{1.73_m2} (ref >59)
GFR calc non Af Amer: 64 mL/min/{1.73_m2} (ref >59)
Globulin, Total: 2.4 g/dL (ref 1.5–4.5)
Glucose: 102 mg/dL — ABNORMAL HIGH (ref 65–99)
Potassium: 4.9 mmol/L (ref 3.5–5.2)
Sodium: 142 mmol/L (ref 134–144)
Total Protein: 6.9 g/dL (ref 6.0–8.5)

## 2020-05-20 LAB — LIPID PANEL WITH LDL/HDL RATIO
Cholesterol, Total: 194 mg/dL (ref 100–199)
HDL: 40 mg/dL (ref >39)
LDL Chol Calc (NIH): 132 mg/dL — ABNORMAL HIGH (ref 0–99)
LDL/HDL Ratio: 3.3 ratio (ref 0.0–3.6)
Triglycerides: 120 mg/dL (ref 0–149)
VLDL Cholesterol Cal: 22 mg/dL (ref 5–40)

## 2020-05-20 LAB — HEPATITIS C ANTIBODY (REFLEX): HCV Ab: <0.1 s/co ratio (ref 0.0–0.9)

## 2020-05-20 LAB — HCV COMMENT:

## 2020-05-20 LAB — HEMOGLOBIN A1C
Est. average glucose Bld gHb Est-mCnc: 105 mg/dL
Hgb A1c MFr Bld: 5.3 % (ref 4.8–5.6)

## 2020-05-20 LAB — TSH: TSH: 2.79 u[IU]/mL (ref 0.450–4.500)

## 2020-05-20 LAB — PSA: Prostate Specific Ag, Serum: 1 ng/mL (ref 0.0–4.0)

## 2020-05-20 LAB — HIV ANTIBODY (ROUTINE TESTING W REFLEX): HIV Screen 4th Generation wRfx: NONREACTIVE

## 2020-05-23 ENCOUNTER — Telehealth: Payer: Self-pay

## 2020-05-23 NOTE — Telephone Encounter (Signed)
-----   Message from Mar Daring, [place] sent at 05/21/2020  8:41 AM EDT ----- Blood count is normal. Kidney function is stable. Liver enzymes are normal. Sodium, potassium and calcium are normal. Cholesterol is improved compared to last year and fairly normal. LDL (bad) cholesterol is still a little elevated at 132. Thyroid is normal. PSA is normal. Sugar/A1c is normal. HIV and Hep C screens are negative.

## 2020-05-23 NOTE — Telephone Encounter (Signed)
Pt advised.   Thanks,   -Shawneen Deetz  

## 2020-05-30 ENCOUNTER — Other Ambulatory Visit: Payer: Self-pay

## 2020-05-30 ENCOUNTER — Telehealth (INDEPENDENT_AMBULATORY_CARE_PROVIDER_SITE_OTHER): Payer: Self-pay | Admitting: Gastroenterology

## 2020-05-30 DIAGNOSIS — Z1211 Encounter for screening for malignant neoplasm of colon: Secondary | ICD-10-CM

## 2020-05-30 MED ORDER — NA SULFATE-K SULFATE-MG SULF 17.5-3.13-1.6 GM/177ML PO SOLN: 1.0000 | Freq: Once | ORAL | 0 refills | Status: AC

## 2020-05-30 NOTE — Progress Notes (Signed)
Gastroenterology Pre-Procedure Review  Request Date: Tuesday 06/27/20 Requesting Physician: Dr. Bonna Gains  PATIENT REVIEW QUESTIONS: The patient responded to the following health history questions as indicated:    1. Are you having any GI issues? no 2. Do you have a personal history of Polyps? no 3. Do you have a family history of Colon Cancer or Polyps? no 4. Diabetes Mellitus? no 5. Joint replacements in the past 12 months?no 6. Major health problems in the past 3 months?no 7. Any artificial heart valves, MVP, or defibrillator?no    MEDICATIONS & ALLERGIES:    Patient reports the following regarding taking any anticoagulation/antiplatelet therapy:   Plavix, Coumadin, Eliquis, Xarelto, Lovenox, Pradaxa, Brilinta, or Effient? no Aspirin? no  Patient confirms/reports the following medications:  Current Outpatient Medications  Medication Sig Dispense Refill  . cetirizine (ZYRTEC) 10 MG tablet Take 10 mg by mouth daily.    . Multiple Vitamin (MULTIVITAMIN) tablet Take 1 tablet by mouth daily.    . Na Sulfate-K Sulfate-Mg Sulf 17.5-3.13-1.6 GM/177ML SOLN Take 1 kit by mouth once for 1 dose. 354 mL 0  . Omega-3 Fatty Acids (FISH OIL) 1200 MG CAPS Take 2 capsules by mouth daily.    . psyllium (METAMUCIL) 58.6 % powder Take 1 packet by mouth 2 (two) times daily.    . sildenafil (VIAGRA) 100 MG tablet Take 1 tablet (100 mg total) by mouth daily as needed for erectile dysfunction. 30 tablet 5  . TURMERIC PO Take by mouth. (Patient not taking: Reported on 05/30/2020)     No current facility-administered medications for this visit.    Patient confirms/reports the following allergies:  No Known Allergies  No orders of the defined types were placed in this encounter.   AUTHORIZATION INFORMATION Primary Insurance: 1D#: Group #:  Secondary Insurance: 1D#: Group #:  SCHEDULE INFORMATION: Date: Tuesday 06/27/20 Time: Location:[facility]

## 2020-06-12 DIAGNOSIS — H9313 Tinnitus, bilateral: Secondary | ICD-10-CM | POA: Insufficient documentation

## 2020-06-13 ENCOUNTER — Other Ambulatory Visit: Payer: Self-pay | Admitting: Acute Care

## 2020-06-13 DIAGNOSIS — H9313 Tinnitus, bilateral: Secondary | ICD-10-CM

## 2020-06-15 ENCOUNTER — Encounter: Payer: Self-pay | Admitting: Gastroenterology

## 2020-06-15 ENCOUNTER — Other Ambulatory Visit: Payer: Self-pay

## 2020-06-23 ENCOUNTER — Other Ambulatory Visit: Payer: Self-pay

## 2020-06-23 ENCOUNTER — Other Ambulatory Visit
Admission: RE | Admit: 2020-06-23 | Discharge: 2020-06-23 | Disposition: A | Discharge status: Home / Self Care | Payer: Managed Care, Other (non HMO) | Source: Ambulatory Visit | Attending: Gastroenterology | Admitting: Gastroenterology

## 2020-06-23 DIAGNOSIS — Z20822 Contact with and (suspected) exposure to covid-19: Secondary | ICD-10-CM | POA: Diagnosis not present

## 2020-06-23 DIAGNOSIS — Z01812 Encounter for preprocedural laboratory examination: Secondary | ICD-10-CM | POA: Insufficient documentation

## 2020-06-24 LAB — SARS CORONAVIRUS 2 (TAT 6-24 HRS): SARS Coronavirus 2: NEGATIVE

## 2020-06-26 NOTE — Discharge Instructions (Signed)
General Anesthesia, Adult, Care After This sheet gives you information about how to care for yourself after your procedure. Your health care provider may also give you more specific instructions. If you have problems or questions, contact your health care provider. What can I expect after the procedure? After the procedure, the following side effects are common:  Pain or discomfort at the IV site.  Nausea.  Vomiting.  Sore throat.  Trouble concentrating.  Feeling cold or chills.  Weak or tired.  Sleepiness and fatigue.  Soreness and body aches. These side effects can affect parts of the body that were not involved in surgery. Follow these instructions at home:  For at least 24 hours after the procedure:  Have a responsible adult stay with you. It is important to have someone help care for you until you are awake and alert.  Rest as needed.  Do not: ? Participate in activities in which you could fall or become injured. ? Drive. ? Use heavy machinery. ? Drink alcohol. ? Take sleeping pills or medicines that cause drowsiness. ? Make important decisions or sign legal documents. ? Take care of children on your own. Eating and drinking  Follow any instructions from your health care provider about eating or drinking restrictions.  When you feel hungry, start by eating small amounts of foods that are soft and easy to digest (bland), such as toast. Gradually return to your regular diet.  Drink enough fluid to keep your urine pale yellow.  If you vomit, rehydrate by drinking water, juice, or clear broth. General instructions  If you have sleep apnea, surgery and certain medicines can increase your risk for breathing problems. Follow instructions from your health care provider about wearing your sleep device: ? Anytime you are sleeping, including during daytime naps. ? While taking prescription pain medicines, sleeping medicines, or medicines that make you drowsy.  Return to  your normal activities as told by your health care provider. Ask your health care provider what activities are safe for you.  Take over-the-counter and prescription medicines only as told by your health care provider.  If you smoke, do not smoke without supervision.  Keep all follow-up visits as told by your health care provider. This is important. Contact a health care provider if:  You have nausea or vomiting that does not get better with medicine.  You cannot eat or drink without vomiting.  You have pain that does not get better with medicine.  You are unable to pass urine.  You develop a skin rash.  You have a fever.  You have redness around your IV site that gets worse. Get help right away if:  You have difficulty breathing.  You have chest pain.  You have blood in your urine or stool, or you vomit blood. Summary  After the procedure, it is common to have a sore throat or nausea. It is also common to feel tired.  Have a responsible adult stay with you for the first 24 hours after general anesthesia. It is important to have someone help care for you until you are awake and alert.  When you feel hungry, start by eating small amounts of foods that are soft and easy to digest (bland), such as toast. Gradually return to your regular diet.  Drink enough fluid to keep your urine pale yellow.  Return to your normal activities as told by your health care provider. Ask your health care provider what activities are safe for you. This information is not   intended to replace advice given to you by your health care provider. Make sure you discuss any questions you have with your health care provider. Document Revised: 11/21/2017 Document Reviewed: 07/04/2017 Elsevier Patient Education  2020 Elsevier Inc[employer]  

## 2020-06-27 ENCOUNTER — Ambulatory Visit: Payer: Managed Care, Other (non HMO) | Admitting: Anesthesiology

## 2020-06-27 ENCOUNTER — Other Ambulatory Visit: Payer: Self-pay

## 2020-06-27 ENCOUNTER — Encounter
Admission: RE | Disposition: A | Discharge status: Home / Self Care | Payer: Self-pay | Source: Home / Self Care | Attending: Gastroenterology

## 2020-06-27 ENCOUNTER — Encounter: Payer: Self-pay | Admitting: Gastroenterology

## 2020-06-27 ENCOUNTER — Other Ambulatory Visit: Payer: Self-pay | Admitting: Gastroenterology

## 2020-06-27 ENCOUNTER — Ambulatory Visit
Admission: RE | Admit: 2020-06-27 | Discharge: 2020-06-27 | Disposition: A | Discharge status: Home / Self Care | Payer: Managed Care, Other (non HMO) | Attending: Gastroenterology | Admitting: Gastroenterology

## 2020-06-27 ENCOUNTER — Ambulatory Visit: Payer: Managed Care, Other (non HMO)

## 2020-06-27 DIAGNOSIS — D124 Benign neoplasm of descending colon: Secondary | ICD-10-CM | POA: Insufficient documentation

## 2020-06-27 DIAGNOSIS — Z6828 Body mass index (BMI) 28.0-28.9, adult: Secondary | ICD-10-CM | POA: Insufficient documentation

## 2020-06-27 DIAGNOSIS — K6389 Other specified diseases of intestine: Secondary | ICD-10-CM | POA: Insufficient documentation

## 2020-06-27 DIAGNOSIS — Z1211 Encounter for screening for malignant neoplasm of colon: Secondary | ICD-10-CM | POA: Insufficient documentation

## 2020-06-27 DIAGNOSIS — K635 Polyp of colon: Secondary | ICD-10-CM | POA: Diagnosis not present

## 2020-06-27 DIAGNOSIS — K5730 Diverticulosis of large intestine without perforation or abscess without bleeding: Secondary | ICD-10-CM | POA: Diagnosis not present

## 2020-06-27 DIAGNOSIS — G4730 Sleep apnea, unspecified: Secondary | ICD-10-CM | POA: Insufficient documentation

## 2020-06-27 HISTORY — PX: POLYPECTOMY: SHX5525

## 2020-06-27 HISTORY — DX: Sleep apnea, unspecified: G47.30

## 2020-06-27 HISTORY — PX: COLONOSCOPY WITH PROPOFOL: SHX5780

## 2020-06-27 SURGERY — COLONOSCOPY WITH PROPOFOL
Anesthesia: General | Site: Rectum

## 2020-06-27 MED ORDER — PROPOFOL 10 MG/ML IV BOLUS
INTRAVENOUS | Status: DC | PRN
  Administered 2020-06-27: 50 mg via INTRAVENOUS
  Administered 2020-06-27 (×7): 20 mg via INTRAVENOUS
  Administered 2020-06-27: 50 mg via INTRAVENOUS
  Administered 2020-06-27 (×5): 20 mg via INTRAVENOUS

## 2020-06-27 MED ORDER — LIDOCAINE HCL (CARDIAC) PF 100 MG/5ML IV SOSY
PREFILLED_SYRINGE | INTRAVENOUS | Status: DC | PRN
  Administered 2020-06-27: 50 mg via INTRAVENOUS

## 2020-06-27 MED ORDER — OXYCODONE HCL 5 MG/5ML PO SOLN: 5.0000 mg | Freq: Once | ORAL | Status: DC | PRN

## 2020-06-27 MED ORDER — LACTATED RINGERS IV SOLN: INTRAVENOUS | Status: DC

## 2020-06-27 MED ORDER — OXYCODONE HCL 5 MG PO TABS: 5.0000 mg | ORAL_TABLET | Freq: Once | ORAL | Status: DC | PRN

## 2020-06-27 MED ORDER — STERILE WATER FOR IRRIGATION IR SOLN: Status: DC | PRN

## 2020-06-27 SURGICAL SUPPLY — 8 items
FORCEPS BIOP RAD 4 LRG CAP 4 (CUTTING FORCEPS) ×3 IMPLANT
GOWN CVR UNV OPN BCK APRN NK (MISCELLANEOUS) ×2 IMPLANT
GOWN ISOL THUMB LOOP REG UNIV (MISCELLANEOUS) ×6
KIT ENDO PROCEDURE OLY (KITS) ×3 IMPLANT
MANIFOLD NEPTUNE II (INSTRUMENTS) ×3 IMPLANT
SNARE COLD EXACTO (MISCELLANEOUS) ×3 IMPLANT
TRAP ETRAP POLY (MISCELLANEOUS) ×3 IMPLANT
WATER STERILE IRR 250ML POUR (IV SOLUTION) ×3 IMPLANT

## 2020-06-27 NOTE — Anesthesia Procedure Notes (Signed)
Procedure Name: MAC Date/Time: 06/27/2020 8:12 AM Performed by: Vanetta Shawl, CRNA Pre-anesthesia Checklist: Patient identified, Emergency Drugs available, Suction available, Timeout performed and Patient being monitored Patient Re-evaluated:Patient Re-evaluated prior to induction Oxygen Delivery Method: Nasal cannula Placement Confirmation: positive ETCO2

## 2020-06-27 NOTE — Anesthesia Preprocedure Evaluation (Signed)
Anesthesia Evaluation  Patient identified by MRN, date of birth, ID band Patient awake    Reviewed: NPO status   Airway Mallampati: II  TM Distance: >3 FB Neck ROM: full    Dental no notable dental hx.    Pulmonary sleep apnea and Continuous Positive Airway Pressure Ventilation ,    Pulmonary exam normal        Cardiovascular Exercise Tolerance: Good negative cardio ROS Normal cardiovascular exam     Neuro/Psych Tremors and tinnitus > w/u by neurology negative neurological ROS  negative psych ROS   GI/Hepatic negative GI ROS, Neg liver ROS,   Endo/Other  Morbid obesity (bmi 28)  Renal/GU negative Renal ROS  negative genitourinary   Musculoskeletal   Abdominal   Peds  Hematology negative hematology ROS (+)   Anesthesia Other Findings Covid: NEG.  Reproductive/Obstetrics                             Anesthesia Physical Anesthesia Plan  ASA: II  Anesthesia Plan: General   Post-op Pain Management:    Induction:   PONV Risk Score and Plan: TIVA and Propofol infusion  Airway Management Planned:   Additional Equipment:   Intra-op Plan:   Post-operative Plan:   Informed Consent: I have reviewed the patients History and Physical, chart, labs and discussed the procedure including the risks, benefits and alternatives for the proposed anesthesia with the patient or authorized representative who has indicated his/her understanding and acceptance.       Plan Discussed with: CRNA  Anesthesia Plan Comments:         Anesthesia Quick Evaluation

## 2020-06-27 NOTE — Anesthesia Postprocedure Evaluation (Signed)
Anesthesia Post Note  Patient: Douglas Coffey  Procedure(s) Performed: COLONOSCOPY WITH PROPOFOL (N/A Rectum) POLYPECTOMY (N/A Rectum)     Patient location during evaluation: PACU Anesthesia Type: General Level of consciousness: awake and alert Pain management: pain level controlled Vital Signs Assessment: post-procedure vital signs reviewed and stable Respiratory status: spontaneous breathing, nonlabored ventilation, respiratory function stable and patient connected to nasal cannula oxygen Cardiovascular status: blood pressure returned to baseline and stable Postop Assessment: no apparent nausea or vomiting Anesthetic complications: no   No complications documented.  Fidel Levy

## 2020-06-27 NOTE — H&P (Signed)
Douglas Antigua, MD [address] [place], [place], [place], [address] [address], [place], [place], [place], [address] Phone: [phone]  Fax: [phone]  Primary Care Physician:  Trinna Post, PA-C   Pre-Procedure History & Physical: HPI:  Douglas Coffey is a 54 y.o. male is here for a colonoscopy.   Past Medical History:  Diagnosis Date  . Hyperlipidemia   . Sleep apnea    CPAP  . Vitamin D deficiency     Past Surgical History:  Procedure Laterality Date  . TOOTH EXTRACTION      Prior to Admission medications   Medication Sig Start Date End Date Taking? Authorizing Provider  cetirizine (ZYRTEC) 10 MG tablet Take 10 mg by mouth daily.   Yes [provider]  CINNAMON PO Take by mouth daily. With Chromium   Yes [provider]  Multiple Vitamin (MULTIVITAMIN) tablet Take 1 tablet by mouth daily.   Yes [provider]  Omega-3 Fatty Acids (FISH OIL) 1200 MG CAPS Take 2 capsules by mouth daily.   Yes [provider]  OVER THE COUNTER MEDICATION daily. Beets   Yes [provider]  psyllium (METAMUCIL) 58.6 % powder Take 1 packet by mouth 2 (two) times daily.   Yes [provider]  sildenafil (VIAGRA) 100 MG tablet Take 1 tablet (100 mg total) by mouth daily as needed for erectile dysfunction. 05/18/20  Yes Mar Daring, PA-C  TURMERIC PO Take by mouth. Patient not taking: Reported on 05/30/2020    [provider]    Allergies as of 05/30/2020  . (No Known Allergies)    Family History  Problem Relation Age of Onset  . Diabetes Mother   . Healthy Brother   . Healthy Daughter     Social History   Socioeconomic History  . Marital status: Married    Spouse name: Not on file  . Number of children: Not on file  . Years of education: Not on file  . Highest education level: Not on file  Occupational History  . Not on file  Tobacco Use  . Smoking status: Never Smoker  . Smokeless  tobacco: Never Used  Vaping Use  . Vaping Use: Never used  Substance and Sexual Activity  . Alcohol use: No    Alcohol/week: 0.0 standard drinks  . Drug use: No  . Sexual activity: Not on file  Other Topics Concern  . Not on file  Social History Narrative  . Not on file   Social Determinants of Health   Financial Resource Strain:   . Difficulty of Paying Living Expenses:   Food Insecurity:   . Worried About Charity fundraiser in the Last Year:   . Arboriculturist in the Last Year:   Transportation Needs:   . Film/video editor (Medical):   Marland Kitchen Lack of Transportation (Non-Medical):   Physical Activity:   . Days of Exercise per Week:   . Minutes of Exercise per Session:   Stress:   . Feeling of Stress :   Social Connections:   . Frequency of Communication with Friends and Family:   . Frequency of Social Gatherings with Friends and Family:   . Attends Religious Services:   . Active Member of Clubs or Organizations:   . Attends Archivist Meetings:   Marland Kitchen Marital Status:   Intimate Partner Violence:   . Fear of Current or Ex-Partner:   . Emotionally Abused:   Marland Kitchen Physically Abused:   .  Sexually Abused:     Review of Systems: See HPI, otherwise negative ROS  Physical Exam: BP (!) 131/85   Temp 97.6 F (36.4 C) (Temporal)   Resp 16   Ht 5\' 11"  (1.803 m)   Wt 91.6 kg   SpO2 99%   BMI 28.17 kg/m  General:   Alert,  pleasant and cooperative in NAD Head:  Normocephalic and atraumatic. Neck:  Supple; no masses or thyromegaly. Lungs:  Clear throughout to auscultation, normal respiratory effort.    Heart:  +S1, +S2, Regular rate and rhythm, No edema. Abdomen:  Soft, nontender and nondistended. Normal bowel sounds, without guarding, and without rebound.   Neurologic:  Alert and  oriented x4;  grossly normal neurologically.  Impression/Plan: Douglas Coffey is here for a colonoscopy to be performed for average risk screening.  Risks, benefits, limitations,  and alternatives regarding  colonoscopy have been reviewed with the patient.  Questions have been answered.  All parties agreeable.   Virgel Manifold, MD  06/27/2020, 8:02 AM

## 2020-06-27 NOTE — Transfer of Care (Signed)
Immediate Anesthesia Transfer of Care Note  Patient: Douglas Coffey  Procedure(s) Performed: COLONOSCOPY WITH PROPOFOL (N/A Rectum) POLYPECTOMY (N/A Rectum)  Patient Location: PACU  Anesthesia Type: General  Level of Consciousness: awake, alert  and patient cooperative  Airway and Oxygen Therapy: Patient Spontanous Breathing and Patient connected to supplemental oxygen  Post-op Assessment: Post-op Vital signs reviewed, Patient's Cardiovascular Status Stable, Respiratory Function Stable, Patent Airway and No signs of Nausea or vomiting  Post-op Vital Signs: Reviewed and stable  Complications: No complications documented.

## 2020-06-27 NOTE — Op Note (Signed)
[facility] Gastroenterology Patient Name: Douglas Coffey Procedure Date: 06/27/2020 7:12 AM MRN: [id number] Account #: [id number] Date of Birth: September 27, 1966 Admit Type: Outpatient Age: 54 Room: [facility] OR ROOM 01 Gender: Male Note Status: Finalized Procedure:             Colonoscopy Indications:           Screening for colorectal malignant neoplasm Providers:             Leialoha Hanna B. Bonna Gains MD, MD Referring MD:          Wendee Beavers. Terrilee Croak (Referring MD) Medicines:             Monitored Anesthesia Care Complications:         No immediate complications. Procedure:             Pre-Anesthesia Assessment:                        - ASA Grade Assessment: II - A patient with mild                         systemic disease.                        - Prior to the procedure, a History and Physical was                         performed, and patient medications, allergies and                         sensitivities were reviewed. The patient's tolerance                         of previous anesthesia was reviewed.                        - The risks and benefits of the procedure and the                         sedation options and risks were discussed with the                         patient. All questions were answered and informed                         consent was obtained.                        - Patient identification and proposed procedure were                         verified prior to the procedure by the physician, the                         nurse, the anesthesiologist, the anesthetist and the                         technician. The procedure was verified in the                         procedure room.  After obtaining informed consent, the colonoscope was                         passed under direct vision. Throughout the procedure,                         the patient's blood pressure, pulse, and oxygen                         saturations were monitored  continuously. The was                         introduced through the anus and advanced to the the                         cecum, identified by appendiceal orifice and ileocecal                         valve. The colonoscopy was performed with ease. The                         patient tolerated the procedure well. The quality of                         the bowel preparation was good. Findings:      The perianal and digital rectal examinations were normal.      A 6 mm polyp was found in the descending colon. The polyp was sessile.       The polyp was removed with a cold snare. Resection and retrieval were       complete.      A 3 mm polyp was found in the descending colon. The polyp was sessile.       The polyp was removed with a cold biopsy forceps. Resection and       retrieval were complete.      A few diverticula were found in the entire colon.      The exam was otherwise without abnormality.      The rectum, sigmoid colon, descending colon, transverse colon, ascending       colon and cecum appeared normal.      The retroflexed view of the distal rectum and anal verge was normal and       showed no anal or rectal abnormalities. Impression:            - One 6 mm polyp in the descending colon, removed with                         a cold snare. Resected and retrieved.                        - One 3 mm polyp in the descending colon, removed with                         a cold biopsy forceps. Resected and retrieved.                        - Diverticulosis in the entire examined colon.                        -  The examination was otherwise normal.                        - The rectum, sigmoid colon, descending colon,                         transverse colon, ascending colon and cecum are normal.                        - The distal rectum and anal verge are normal on                         retroflexion view. Recommendation:        - Discharge patient to home (with escort).                         - Advance diet as tolerated.                        - Continue present medications.                        - Await pathology results.                        - Repeat colonoscopy in 5 years if polyps show                         adenoma, 10 years if they are hyperplastic.                        - The findings and recommendations were discussed with                         the patient.                        - The findings and recommendations were discussed with                         the patient's family.                        - Return to primary care physician as previously                         scheduled.                        - High fiber diet. Procedure Code(s):     --- Professional ---                        [phone], Colonoscopy, flexible; with removal of                         tumor(s), polyp(s), or other lesion(s) by snare                         technique  16109, 36, Colonoscopy, flexible; with biopsy, single                         or multiple Diagnosis Code(s):     --- Professional ---                        Z12.11, Encounter for screening for malignant neoplasm                         of colon                        K63.5, Polyp of colon CPT copyright 2019 American Medical Association. All rights reserved. The codes documented in this report are preliminary and upon coder review may  be revised to meet current compliance requirements.  Vonda Antigua, MD Margretta Sidle B. Bonna Gains MD, MD 06/27/2020 8:42:20 AM This report has been signed electronically. Number of Addenda: 0 Note Initiated On: 06/27/2020 7:12 AM Scope Withdrawal Time: 0 hours 21 minutes 26 seconds  Total Procedure Duration: 0 hours 24 minutes 35 seconds  Estimated Blood Loss:  Estimated blood loss: none.      [facility]

## 2020-06-28 ENCOUNTER — Encounter: Payer: Self-pay | Admitting: Gastroenterology

## 2020-06-28 LAB — SURGICAL PATHOLOGY

## 2020-06-29 ENCOUNTER — Encounter: Payer: Self-pay | Admitting: Gastroenterology

## 2020-06-30 LAB — PATHOLOGY

## 2020-07-06 ENCOUNTER — Encounter: Payer: Self-pay | Admitting: Gastroenterology

## 2020-07-06 ENCOUNTER — Other Ambulatory Visit: Payer: Self-pay

## 2020-07-06 ENCOUNTER — Ambulatory Visit
Admission: RE | Admit: 2020-07-06 | Discharge: 2020-07-06 | Disposition: A | Discharge status: Home / Self Care | Payer: Managed Care, Other (non HMO) | Source: Ambulatory Visit | Attending: Acute Care | Admitting: Acute Care

## 2020-07-06 DIAGNOSIS — H9313 Tinnitus, bilateral: Secondary | ICD-10-CM | POA: Diagnosis present

## 2020-09-22 IMAGING — MR MR HEAD W/O CM
13 of 15 series · 36 of 48 positions shown · non-contrast
Comparison: None.

CLINICAL DATA: Bilateral tinnitus several months.

EXAM:
MRI HEAD WITHOUT CONTRAST
TECHNIQUE: Multiplanar, multiecho pulse sequences of the brain and surrounding
structures were obtained without intravenous contrast.

[Series 5: ax dwi_tracew · axial · 3.0mm · 0.60mm/px · z∈[-123,+32]mm · 2 of 48 slices shown]
[im 1/48]
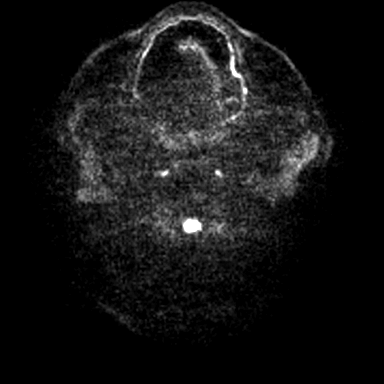
[im 48/48]
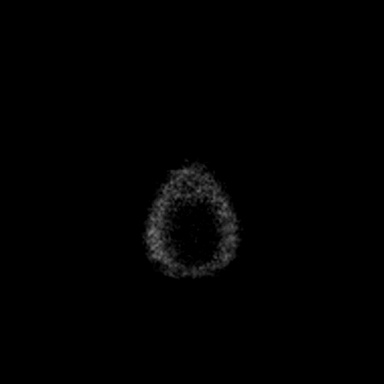

[Series 6: ax dwi_adc · axial · 3.0mm · 0.60mm/px · z∈[-123,+32]mm · 3 of 48 slices shown]
[im 1/48]
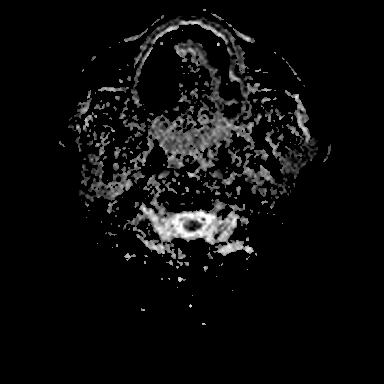
[im 24/48]
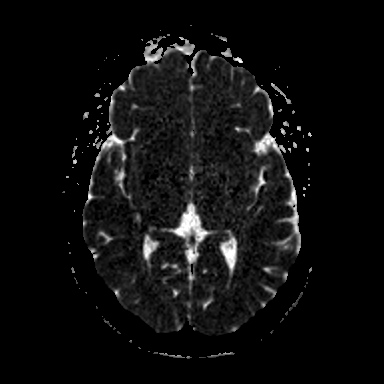
[im 48/48]
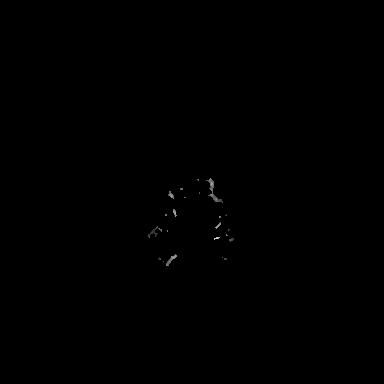

[Series 7: cor dwi_tracew · coronal · 5.0mm · 0.60mm/px · 2 of 38 slices shown]
[im 1/38]
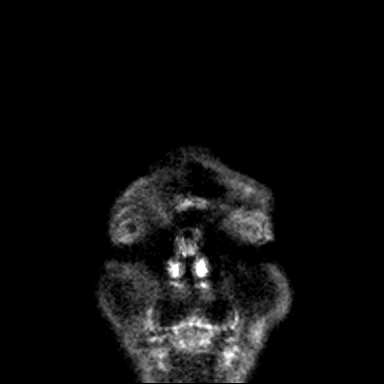
[im 38/38]
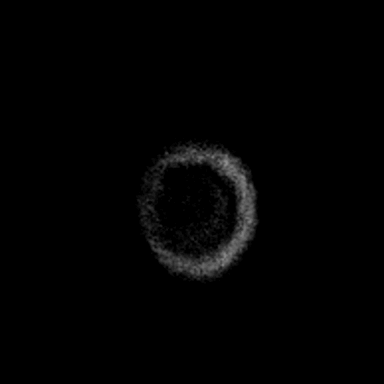

[Series 8: cor dwi_adc · coronal · 5.0mm · 0.60mm/px · 2 of 38 slices shown]
[im 1/38]
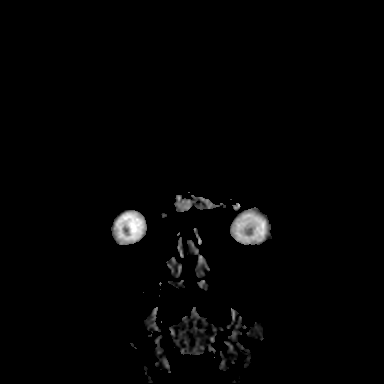
[im 38/38]
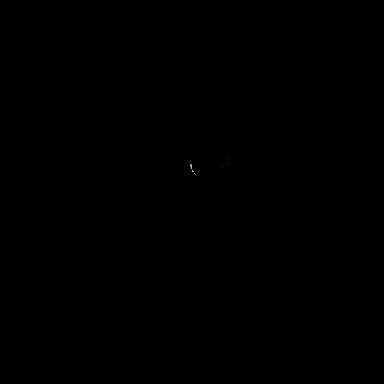

[Series 9: T2 · axial · 5.0mm · 0.53mm/px · z∈[-126,+29]mm · 2 of 27 slices shown (1 of 2)]
[im 1/27]
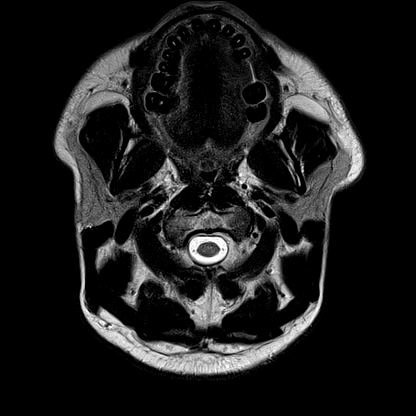
[im 27/27]
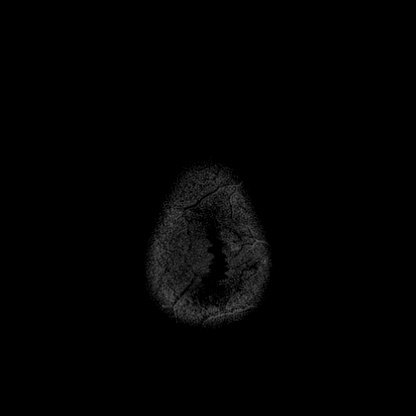

[Series 10: T1 · sagittal · 5.0mm · 0.62mm/px · 2 of 25 slices shown (1 of 4)]
[im 1/25]
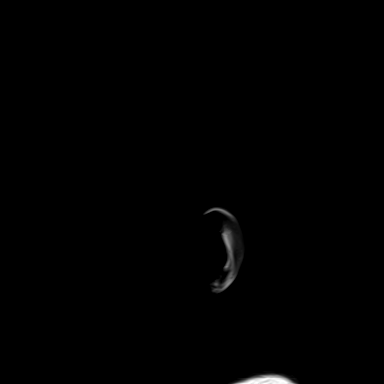
[im 25/25]
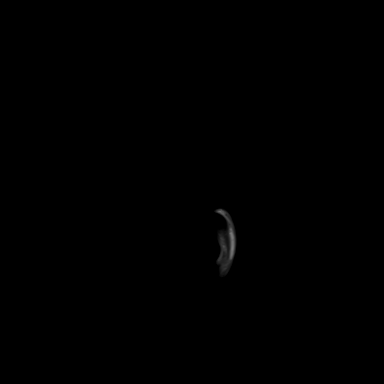

[Series 11: T1 · coronal · non-contrast · 3.0mm · 0.21mm/px · 1 of 13 slices shown (2 of 4)]
[im 1/13]
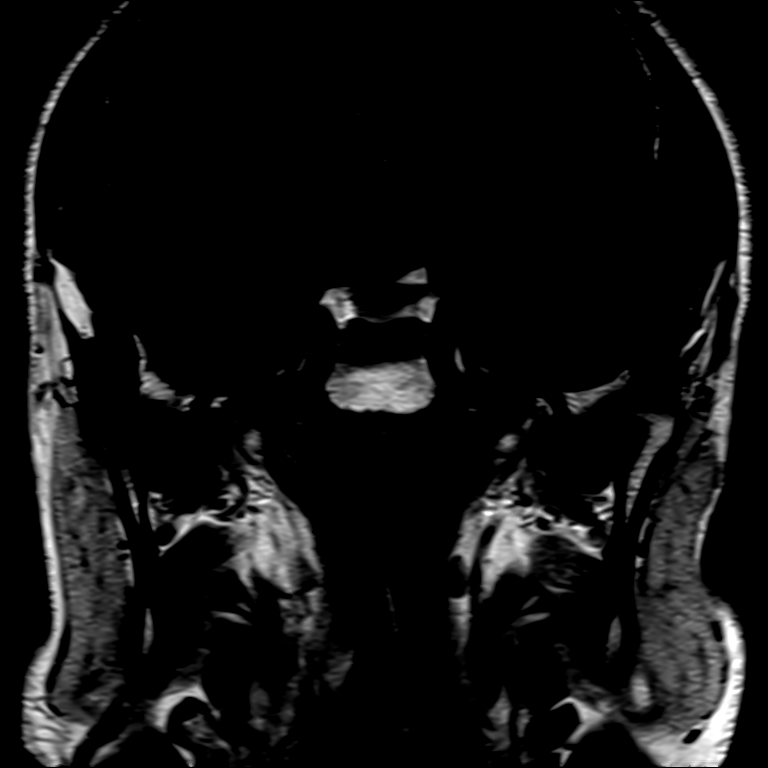

[Series 12: mag_images · axial · 3.0mm · 0.90mm/px · z∈[-136,+40]mm · 4 of 60 slices shown]
[im 1/60]
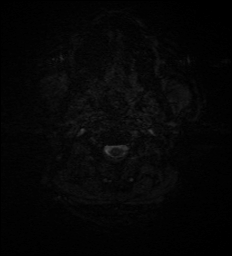
[im 20/60]
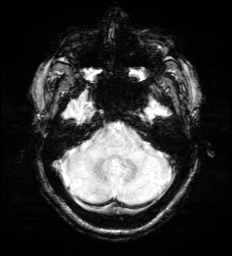
[im 40/60]
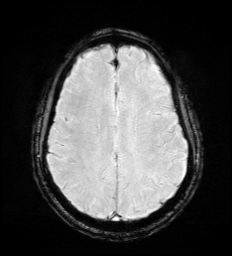
[im 60/60]
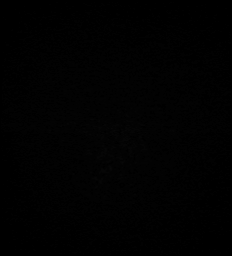

[Series 13: pha_images · axial · 3.0mm · 0.90mm/px · z∈[-136,-23]mm · 3 of 58 slices shown]
[im 1/58]
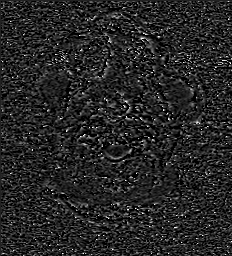
[im 20/58]
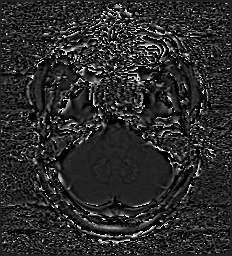
[im 39/58]
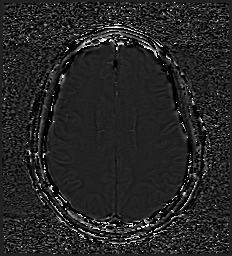

[Series 16: FLAIR · axial · 3.0mm · 0.53mm/px · z∈[-129,+32]mm · 4 of 55 slices shown]
[im 1/55]
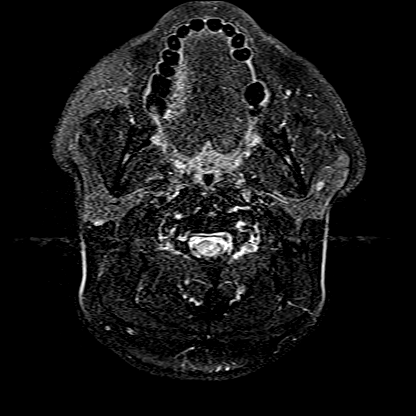
[im 19/55]
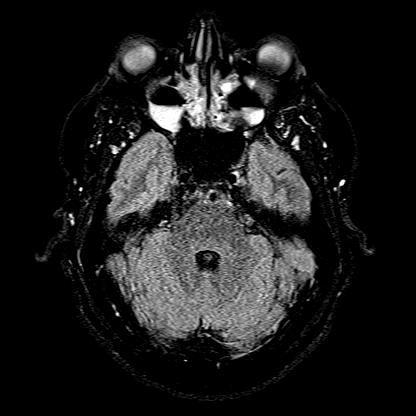
[im 37/55]
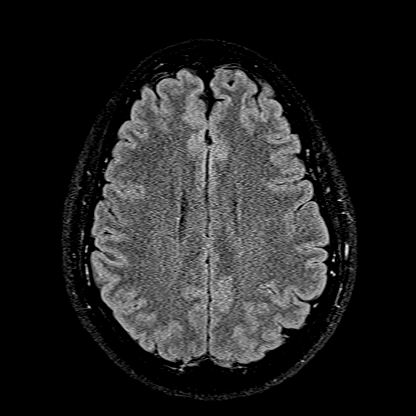
[im 55/55]
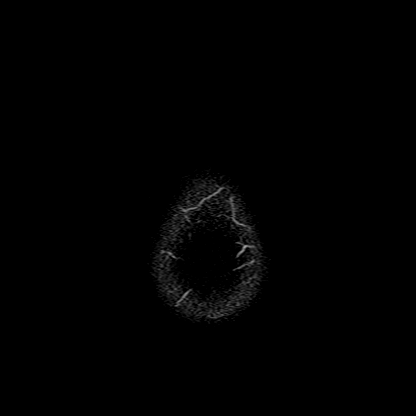

[Series 17: T1 · axial · 1.0mm · 0.98mm/px · z∈[-136,+38]mm · 8 of 176 slices shown (3 of 4)]
[im 1/176]
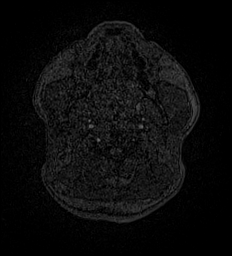
[im 36/176]
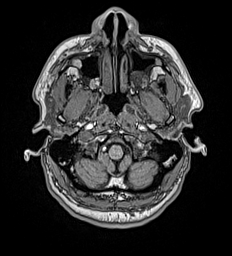
[im 53/176]
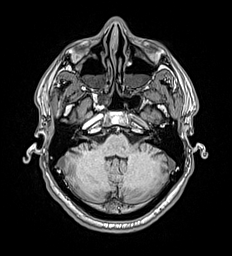
[im 71/176]
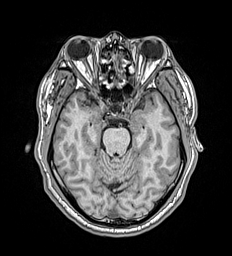
[im 106/176]
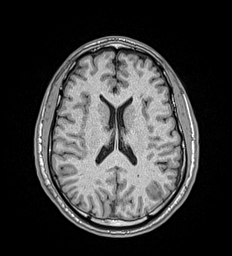
[im 123/176]
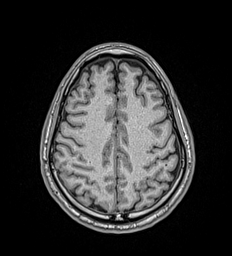
[im 141/176]
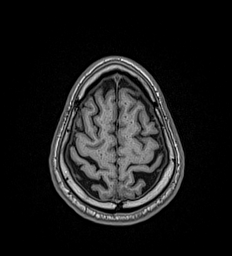
[im 176/176]
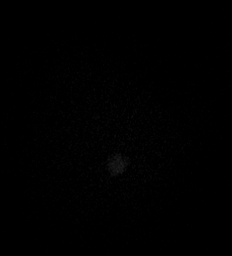

[Series 18: T2 · coronal · 5.0mm · 0.57mm/px · 2 of 29 slices shown (2 of 2)]
[im 1/29]
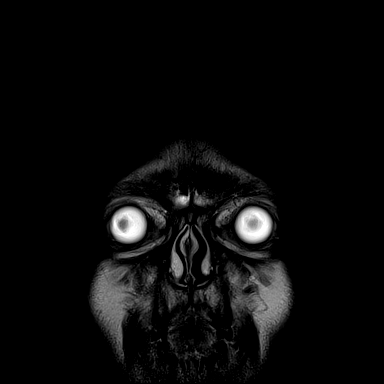
[im 29/29]
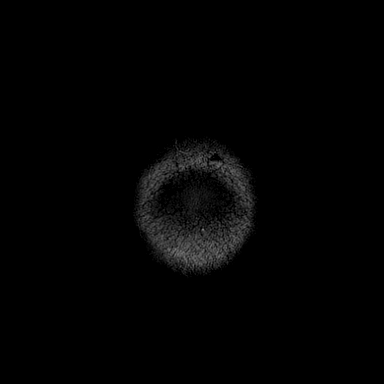

[Series 20: T1 · axial · non-contrast · 3.0mm · 0.21mm/px · 1 of 15 slices shown (4 of 4)]
[im 1/15]
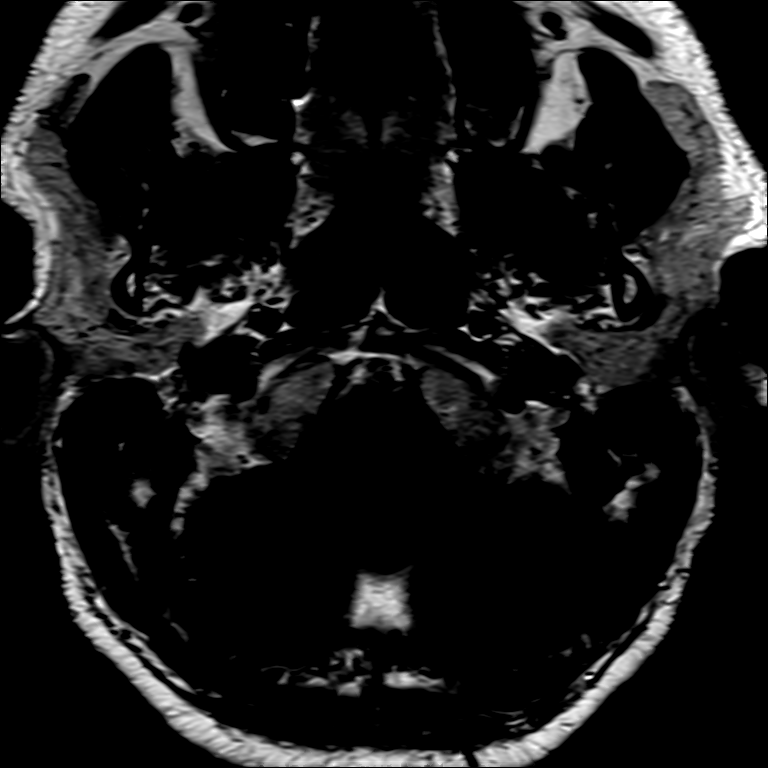

[36 of 48 positions shown; findings below may reference images not displayed]

FINDINGS: Brain: Thin sections through the posterior fossa were obtained
without intravenous contrast. Seventh and eighth cranial nerves
normal. Negative for vestibular schwannoma. Basilar cisterns normal.
Brainstem and cerebellum normal. Mastoid sinus clear bilaterally.

Ventricle size and cerebral volume normal. Negative for infarct,
hemorrhage, mass lesion.

Vascular: Normal arterial flow voids.

Skull and upper cervical spine: No focal bone marrow lesion.

Sinuses/Orbits: Extensive mucosal edema throughout the paranasal
sinuses with large air-fluid levels in both maxillary sinus. Mastoid
clear bilaterally

Other: None
IMPRESSION: Negative MRI head without contrast. Posterior fossa appears normal
without mass lesion.

Pansinusitis with prominent air-fluid levels in both maxillary
sinuses.

## 2021-01-11 ENCOUNTER — Telehealth: Payer: Self-pay | Admitting: Physician Assistant

## 2021-01-11 NOTE — Telephone Encounter (Signed)
Patient was advised and states that he will cal the office back tomorrow,01/12/2021 to schedule appointment.

## 2021-01-11 NOTE — Telephone Encounter (Signed)
OV

## 2021-01-11 NOTE — Telephone Encounter (Signed)
Patient called to request some medication for his sinus infection. He did not want to make an appt. Unless he had to.  He stated that before when he had a sinus infection, the doctor would prescribe an antibiotic.  Please advise and call patient to discuss at [phone]

## 2021-01-11 NOTE — Telephone Encounter (Signed)
Just an FYI-Patient has received antibiotic from this office once in 2019 during an office visit

## 2021-01-23 ENCOUNTER — Ambulatory Visit: Payer: Self-pay | Admitting: *Deleted

## 2021-01-23 NOTE — Telephone Encounter (Signed)
No triage necessary.    Answered question for agent.   Pt wanting to come into office and be seen for URI symptoms that he has had for 2 weeks and called the office but Fabio Bering said he needs an office visit before she could prescribe an antibiotic.  Due to his symptoms and covid pandemic he needs a virtual appt due to his symptoms.    He wants to come in.   I referred agent to make a virtual visit since Fabio Bering already indicated he needed it before prescribing him anything.  He is also requesting a chest x ray.  If he insists on an office visit to refer him to the office for further guidance.

## 2021-01-24 ENCOUNTER — Telehealth (INDEPENDENT_AMBULATORY_CARE_PROVIDER_SITE_OTHER): Payer: Managed Care, Other (non HMO) | Admitting: Physician Assistant

## 2021-01-24 DIAGNOSIS — J40 Bronchitis, not specified as acute or chronic: Secondary | ICD-10-CM | POA: Diagnosis not present

## 2021-01-24 DIAGNOSIS — R059 Cough, unspecified: Secondary | ICD-10-CM | POA: Diagnosis not present

## 2021-01-24 MED ORDER — PREDNISONE 20 MG PO TABS: 20.0000 mg | ORAL_TABLET | Freq: Every day | ORAL | 0 refills | Status: AC

## 2021-01-24 MED ORDER — BENZONATATE 100 MG PO CAPS: 100.0000 mg | ORAL_CAPSULE | Freq: Three times a day (TID) | ORAL | 0 refills | Status: AC | PRN

## 2021-01-24 NOTE — Patient Instructions (Signed)

## 2021-01-24 NOTE — Progress Notes (Signed)
MyChart Video Visit    Virtual Visit via Video Note   This visit type was conducted due to national recommendations for restrictions regarding the COVID-19 Pandemic (e.g. social distancing) in an effort to limit this patient's exposure and mitigate transmission in our community. This patient is at least at moderate risk for complications without adequate follow up. This format is felt to be most appropriate for this patient at this time. Physical exam was limited by quality of the video and audio technology used for the visit.   Patient location: Home Provider location: Office   I discussed the limitations of evaluation and management by telemedicine and the availability of in person appointments. The patient expressed understanding and agreed to proceed.  Patient: Douglas Coffey   DOB: 1966/02/27   55 y.o. Male  MRN: [id number] Visit Date: 01/24/2021  Today's healthcare provider: Trinna Post, PA-C   Chief Complaint  Patient presents with  . Cough  I,Porsha C McClurkin,acting as a scribe for Trinna Post, PA-C.,have documented all relevant documentation on the behalf of Trinna Post, PA-C,as directed by  Trinna Post, PA-C while in the presence of Trinna Post, PA-C.  Subjective    Cough This is a recurrent problem. The current episode started 1 to 4 weeks ago. The problem has been unchanged. The cough is productive of sputum. Associated symptoms include shortness of breath and wheezing. Pertinent negatives include no chest pain, chills or fever. He has tried OTC cough suppressant for the symptoms. There is no history of asthma, bronchiectasis, bronchitis, COPD, emphysema, environmental allergies or pneumonia.     He reports he had some sinus congestion two weeks ago as well as facial pain. He reports this has mostly resolved but since he has been coughing. Does have some occasional wheezing. Is travelling out of town for work trip.    Medications: Outpatient  Medications Prior to Visit  Medication Sig  . cetirizine (ZYRTEC) 10 MG tablet Take 10 mg by mouth daily.  Marland Kitchen CINNAMON PO Take by mouth daily. With Chromium  . Multiple Vitamin (MULTIVITAMIN) tablet Take 1 tablet by mouth daily.  . Omega-3 Fatty Acids (FISH OIL) 1200 MG CAPS Take 2 capsules by mouth daily.  Marland Kitchen OVER THE COUNTER MEDICATION daily. Beets  . psyllium (METAMUCIL) 58.6 % powder Take 1 packet by mouth 2 (two) times daily.  . sildenafil (VIAGRA) 100 MG tablet Take 1 tablet (100 mg total) by mouth daily as needed for erectile dysfunction.  . TURMERIC PO Take by mouth. (Patient not taking: No sig reported)   No facility-administered medications prior to visit.    Review of Systems  Constitutional: Negative for appetite change, chills, fatigue and fever.  HENT: Positive for congestion.   Respiratory: Positive for cough, shortness of breath and wheezing. Negative for chest tightness.   Cardiovascular: Negative for chest pain.  Gastrointestinal: Negative for diarrhea, nausea and vomiting.  Allergic/Immunologic: Negative for environmental allergies.      Objective    There were no vitals taken for this visit.   Physical Exam Constitutional:      Appearance: Normal appearance.  Pulmonary:     Effort: Pulmonary effort is normal. No respiratory distress.  Neurological:     Mental Status: He is alert.  Psychiatric:        Mood and Affect: Mood normal.        Behavior: Behavior normal.        Assessment & Plan    1. Bronchitis  -  benzonatate (TESSALON PERLES) 100 MG capsule; Take 1 capsule (100 mg total) by mouth 3 (three) times daily as needed for up to 7 days.  Dispense: 21 capsule; Refill: 0 - predniSONE (DELTASONE) 20 MG tablet; Take 1 tablet (20 mg total) by mouth daily with breakfast for 5 days.  Dispense: 5 tablet; Refill: 0  2. Cough  - DG Chest 2 View; Future - benzonatate (TESSALON PERLES) 100 MG capsule; Take 1 capsule (100 mg total) by mouth 3 (three) times  daily as needed for up to 7 days.  Dispense: 21 capsule; Refill: 0   No follow-ups on file.     I discussed the assessment and treatment plan with the patient. The patient was provided an opportunity to ask questions and all were answered. The patient agreed with the plan and demonstrated an understanding of the instructions.   The patient was advised to call back or seek an in-person evaluation if the symptoms worsen or if the condition fails to improve as anticipated.   ITrinna Post, PA-C, have reviewed all documentation for this visit. The documentation on 01/24/21 for the exam, diagnosis, procedures, and orders are all accurate and complete.  The entirety of the information documented in the History of Present Illness, Review of Systems and Physical Exam were personally obtained by me. Portions of this information were initially documented by [facility] and reviewed by me for thoroughness and accuracy.    Paulene Floor [facility] [phone] (phone) [phone] (fax)  [place]

## 2022-06-06 NOTE — Progress Notes (Signed)
I,Sulibeya S Dimas,acting as a Education administrator for [employer], PA-C.,have documented all relevant documentation on the behalf of Douglas Kirschner, PA-C,as directed by  Douglas Kirschner, PA-C while in the presence of Douglas Kirschner, PA-C.   Established patient visit   Patient: Douglas Coffey   DOB: [birth year]   56 y.o. Male  MRN: [id number] Visit Date: 06/07/2022  Today's healthcare provider: [name], PA-C   Chief Complaint  Patient presents with   Finger Injury   Subjective    HPI  Follow up for finger injury 05/20/22 Pt was cutting cucumbers and lanced the tip of his right pinky finger off with a mandolin. After 1-2 hours when the  bleeding did not stop he went to see urgent care. They stopped the bleeding w/ glue. Denies stiches.   Pt is concerned over how the finger looks, worried about infection. Denies pain, swelling, decreased ROM. Small amount of clear/serous discharge. -----------------------------------------------------------------------------------------   Medications: Outpatient Medications Prior to Visit  Medication Sig   cetirizine (ZYRTEC) 10 MG tablet Take 10 mg by mouth daily.   CINNAMON PO Take by mouth daily. With Chromium   Multiple Vitamin (MULTIVITAMIN) tablet Take 1 tablet by mouth daily.   Omega-3 Fatty Acids (FISH OIL) 1200 MG CAPS Take 2 capsules by mouth daily.   OVER THE COUNTER MEDICATION daily. Beets   psyllium (METAMUCIL) 58.6 % powder Take 1 packet by mouth 2 (two) times daily.   sildenafil (VIAGRA) 100 MG tablet Take 1 tablet (100 mg total) by mouth daily as needed for erectile dysfunction.   TURMERIC PO Take by mouth.   No facility-administered medications prior to visit.    Review of Systems  Constitutional:  Negative for appetite change, chills and fever.  Respiratory:  Negative for shortness of breath.   Cardiovascular:  Negative for chest pain and palpitations.  Gastrointestinal:  Negative for abdominal pain, diarrhea, nausea and  vomiting.  Skin:  Positive for color change and wound.       Objective    Blood pressure 117/77, pulse 71, temperature 98.4 F (36.9 C), resp. rate 16, weight 205 lb (93 kg), SpO2 100 %.   Physical Exam Vitals reviewed.  Constitutional:      Appearance: He is not ill-appearing.  HENT:     Head: Normocephalic.  Eyes:     Conjunctiva/sclera: Conjunctivae normal.  Cardiovascular:     Rate and Rhythm: Normal rate.  Pulmonary:     Effort: Pulmonary effort is normal. No respiratory distress.  Skin:    Comments: R pinky finger w/ a hemostatic laceration covered in layers of surgical glue. Appropriate capillary refill, no edema, erythema, purulent discharge  Neurological:     General: No focal deficit present.     Mental Status: He is alert and oriented to person, place, and time.  Psychiatric:        Mood and Affect: Mood normal.        Behavior: Behavior normal.      No results found for any visits on 06/07/22.  Assessment & Plan     Laceration right 5th digit Healing appropriately. Significant amount of glue still adherent. Advised he use a warm washcloth to soften glue-- but on a daily basis keep area clean and dry. No sign of infection  Return in about 4 weeks (around 07/05/2022) for CPE.      I, Douglas Kirschner, PA-C have reviewed all documentation for this visit. The documentation on  06/07/2022 for the exam, diagnosis, procedures, and  orders are all accurate and complete.  Douglas Kirschner, PA-C [facility] [address] [place], [place], [address] Office: [phone] Fax: [place]

## 2022-06-07 ENCOUNTER — Encounter: Payer: Self-pay | Admitting: Physician Assistant

## 2022-06-07 ENCOUNTER — Ambulatory Visit: Payer: Managed Care, Other (non HMO) | Admitting: Physician Assistant

## 2022-06-07 VITALS — BP 117/77 | HR 71 | Temp 98.4°F | Resp 16 | Wt 205.0 lb

## 2022-06-07 DIAGNOSIS — S61216A Laceration without foreign body of right little finger without damage to nail, initial encounter: Secondary | ICD-10-CM | POA: Diagnosis not present

## 2022-06-21 ENCOUNTER — Encounter: Payer: Managed Care, Other (non HMO) | Admitting: Physician Assistant

## 2023-02-14 ENCOUNTER — Ambulatory Visit: Payer: Self-pay | Admitting: *Deleted

## 2023-02-14 NOTE — Telephone Encounter (Signed)
2nd attempt, pt called, unable to LVM d/t line keeps ringing.

## 2023-02-14 NOTE — Telephone Encounter (Signed)
Summary: Sinus pressure with scratchy voice & throat advice   Pt is calling to report sx with thick mucus, sinus pressure, scratchy throat & voice. Pt is unable to make the one appt left for today. Pt is interested in virtual options for an appt today. Please advise       Attempted to call patient- no answer, unable to leave message

## 2023-02-14 NOTE — Telephone Encounter (Signed)
3rd attempt, pt called, line keeps ringing, unable to LVM. Called pt cell #, [id number]. Unable to reach patient after 3 attempts by [employer] NT, routing to the provider for resolution per protocol.

## 2023-05-19 ENCOUNTER — Encounter: Payer: Self-pay | Admitting: Family Medicine

## 2023-05-19 ENCOUNTER — Ambulatory Visit (INDEPENDENT_AMBULATORY_CARE_PROVIDER_SITE_OTHER): Payer: Managed Care, Other (non HMO) | Admitting: Family Medicine

## 2023-05-19 VITALS — BP 132/89 | HR 67 | Ht 71.0 in | Wt 207.1 lb

## 2023-05-19 DIAGNOSIS — G250 Essential tremor: Secondary | ICD-10-CM | POA: Insufficient documentation

## 2023-05-19 DIAGNOSIS — Z23 Encounter for immunization: Secondary | ICD-10-CM

## 2023-05-19 DIAGNOSIS — N529 Male erectile dysfunction, unspecified: Secondary | ICD-10-CM

## 2023-05-19 DIAGNOSIS — Z0000 Encounter for general adult medical examination without abnormal findings: Secondary | ICD-10-CM | POA: Insufficient documentation

## 2023-05-19 DIAGNOSIS — G4733 Obstructive sleep apnea (adult) (pediatric): Secondary | ICD-10-CM | POA: Diagnosis not present

## 2023-05-19 DIAGNOSIS — E663 Overweight: Secondary | ICD-10-CM | POA: Insufficient documentation

## 2023-05-19 DIAGNOSIS — E7800 Pure hypercholesterolemia, unspecified: Secondary | ICD-10-CM

## 2023-05-19 DIAGNOSIS — E559 Vitamin D deficiency, unspecified: Secondary | ICD-10-CM

## 2023-05-19 MED ORDER — PROPRANOLOL HCL 20 MG PO TABS: 20.0000 mg | ORAL_TABLET | Freq: Two times a day (BID) | ORAL | 2 refills | Status: AC

## 2023-05-19 MED ORDER — SILDENAFIL CITRATE 100 MG PO TABS: 100.0000 mg | ORAL_TABLET | Freq: Every day | ORAL | 5 refills | Status: DC | PRN

## 2023-05-19 NOTE — Assessment & Plan Note (Signed)
Chronic  Becoming more frequent  Recommended propranolol 20mg  twice daily for situations in which tremor is more evident

## 2023-05-19 NOTE — Assessment & Plan Note (Signed)
Chronic  Stable with CPAP use  Patient advised to continue CPAP nightly  The patient has had significant benefit from the use of CPAP machine with considerable improvements in quality of life, work production and decrease medical complaints.  The patient will need continued maintenance and care CPAP supplies (including upgrades as deemed appropriate) in order to continue treatment for sleep apnea as this is medically necessary.     

## 2023-05-19 NOTE — Progress Notes (Signed)
I,Sha'taria Tyson,acting as a Neurosurgeon for [employer], MD.,have documented all relevant documentation on the behalf of Ronnald Ramp, MD,as directed by  Ronnald Ramp, MD while in the presence of Ronnald Ramp, MD.   Complete physical exam   Patient: Douglas Coffey   DOB: 10/20/1966   57 y.o. Male  MRN: [id number] Visit Date: 05/19/2023  Today's healthcare provider: Ronnald Ramp, MD   No chief complaint on file.  Subjective    Douglas Coffey is a 57 y.o. male who presents today for a complete physical exam.   He reports consuming a general diet.   The patient reports going walking at least three times a week for twenty to thirty minutes.  He generally feels well.  He reports sleeping well.  He does not have additional problems to discuss today.   HPI  -Shingles Vaccine: declined -Tetanus Vaccine: ok to administer today -Noticing tremors are getting a little worse for the past year and was advised about them years ago from a different provider in this office  Past Medical History:  Diagnosis Date   Hyperlipidemia    Sleep apnea    CPAP   Vitamin D deficiency    Past Surgical History:  Procedure Laterality Date   COLONOSCOPY WITH PROPOFOL N/A 06/27/2020   Procedure: COLONOSCOPY WITH PROPOFOL;  Surgeon: Pasty Spillers, MD;  Location: [facility];  Service: Endoscopy;  Laterality: N/A;  priority 4 sleep apnea   POLYPECTOMY N/A 06/27/2020   Procedure: POLYPECTOMY;  Surgeon: Pasty Spillers, MD;  Location: [facility];  Service: Endoscopy;  Laterality: N/A;   TOOTH EXTRACTION     Social History   Socioeconomic History   Marital status: Married    Spouse name: Not on file   Number of children: Not on file   Years of education: Not on file   Highest education level: Not on file  Occupational History   Not on file  Tobacco Use   Smoking status: Never   Smokeless tobacco: Never  Vaping  Use   Vaping Use: Never used  Substance and Sexual Activity   Alcohol use: No    Alcohol/week: 0.0 standard drinks of alcohol   Drug use: No   Sexual activity: Not on file  Other Topics Concern   Not on file  Social History Narrative   Not on file   Social Determinants of Health   Financial Resource Strain: Not on file  Food Insecurity: Not on file  Transportation Needs: Not on file  Physical Activity: Not on file  Stress: Not on file  Social Connections: Not on file  Intimate Partner Violence: Not on file   Family Status  Relation Name Status   Mother  Alive   Father  Alive   MGM  Alive   MGF  Deceased   PGM  Deceased   PGF  Deceased   Brother  Alive   Daughter  Alive   Family History  Problem Relation Age of Onset   Diabetes Mother    Healthy Brother    Healthy Daughter    No Known Allergies  Patient Care Team: Trey Sailors, PA-C (Inactive) as PCP - General (Physician Assistant)   Medications: Outpatient Medications Prior to Visit  Medication Sig   cetirizine (ZYRTEC) 10 MG tablet Take 10 mg by mouth daily.   CINNAMON PO Take by mouth daily. With Chromium   Multiple Vitamin (MULTIVITAMIN) tablet Take 1 tablet by mouth daily.   Omega-3 Fatty Acids (FISH  OIL) 1200 MG CAPS Take 2 capsules by mouth daily.   OVER THE COUNTER MEDICATION daily. Beets   psyllium (METAMUCIL) 58.6 % powder Take 1 packet by mouth 2 (two) times daily.   [DISCONTINUED] sildenafil (VIAGRA) 100 MG tablet Take 1 tablet (100 mg total) by mouth daily as needed for erectile dysfunction.   [DISCONTINUED] TURMERIC PO Take by mouth. (Patient not taking: Reported on 05/19/2023)   No facility-administered medications prior to visit.    Review of Systems    Objective    BP 132/89 (BP Location: Right Arm, Patient Position: Sitting, Cuff Size: Normal)   Pulse 67   Ht 5\' 11"  (1.803 m)   Wt 207 lb 1.6 oz (93.9 kg)   SpO2 99%   BMI 28.88 kg/m     Physical Exam Vitals reviewed.   Constitutional:      General: He is not in acute distress.    Appearance: Normal appearance. He is not ill-appearing, toxic-appearing or diaphoretic.  HENT:     Head: Normocephalic and atraumatic.     Right Ear: Tympanic membrane and external ear normal.     Left Ear: Tympanic membrane and external ear normal.     Nose: Nose normal.     Mouth/Throat:     Mouth: Mucous membranes are moist.     Pharynx: No oropharyngeal exudate or posterior oropharyngeal erythema.  Eyes:     General: No scleral icterus.    Extraocular Movements: Extraocular movements intact.     Conjunctiva/sclera: Conjunctivae normal.     Pupils: Pupils are equal, round, and reactive to light.  Neck:     Vascular: No carotid bruit.  Cardiovascular:     Rate and Rhythm: Normal rate and regular rhythm.     Pulses: Normal pulses.     Heart sounds: Normal heart sounds. No murmur heard.    No friction rub. No gallop.  Pulmonary:     Effort: Pulmonary effort is normal. No respiratory distress.     Breath sounds: Normal breath sounds. No stridor. No wheezing, rhonchi or rales.  Abdominal:     General: Bowel sounds are normal. There is no distension.     Palpations: Abdomen is soft.     Tenderness: There is no abdominal tenderness.  Musculoskeletal:        General: No swelling, tenderness or signs of injury. Normal range of motion.     Cervical back: Normal range of motion and neck supple. No rigidity or tenderness.     Right lower leg: No edema.     Left lower leg: No edema.  Lymphadenopathy:     Cervical: No cervical adenopathy.  Skin:    General: Skin is warm and dry.     Capillary Refill: Capillary refill takes less than 2 seconds.     Findings: No erythema or rash.  Neurological:     General: No focal deficit present.     Mental Status: He is alert and oriented to person, place, and time.     Cranial Nerves: Cranial nerves 2-12 are intact.     Sensory: Sensation is intact.     Motor: Tremor present. No  weakness or abnormal muscle tone.     Coordination: Coordination normal. Finger-Nose-Finger Test and Heel to Shin Test normal.     Gait: Gait normal.     Comments: Negative pronator drift testing   Psychiatric:        Attention and Perception: Attention normal.        Mood  and Affect: Mood normal.        Speech: Speech normal.        Behavior: Behavior normal. Behavior is cooperative.        Thought Content: Thought content normal.       Last depression screening scores    05/19/2023    8:16 AM 06/07/2022    9:26 AM 05/18/2020   10:19 AM  PHQ 2/9 Scores  PHQ - 2 Score 1 0 0  PHQ- 9 Score 3 1    Last fall risk screening    05/19/2023    8:16 AM  Fall Risk   Falls in the past year? 0  Number falls in past yr: 0  Injury with Fall? 0  Risk for fall due to : No Fall Risks  Follow up Falls evaluation completed   Last Audit-C alcohol use screening    05/19/2023    8:16 AM  Alcohol Use Disorder Test (AUDIT)  1. How often do you have a drink containing alcohol? 0  3. How often do you have six or more drinks on one occasion? 0   A score of 3 or more in women, and 4 or more in men indicates increased risk for alcohol abuse, EXCEPT if all of the points are from question 1   No results found for any visits on 05/19/23.  Assessment & Plan    Routine Health Maintenance and Physical Exam  Exercise Activities and Dietary recommendations  Goals   None     Immunization History  Administered Date(s) Administered   COVID-19, mRNA, vaccine(Comirnaty)12 years and older 01/02/2023   MMR 06/22/1999   Moderna Sars-Covid-2 Vaccination 02/11/2020, 03/14/2020   PFIZER(Purple Top)SARS-COV-2 Vaccination 11/22/2020   Td 05/19/2023   Tdap 11/23/2009    Health Maintenance  Topic Date Due   Zoster Vaccines- Shingrix (1 of 2) 08/19/2023 (Originally 04/05/2018)   INFLUENZA VACCINE  07/03/2023   Colonoscopy  06/27/2025   DTaP/Tdap/Td (3 - Td or Tdap) 05/18/2033   COVID-19 Vaccine  Completed    Hepatitis C Screening  Completed   HIV Screening  Completed   HPV VACCINES  Aged Out    Problem List Items Addressed This Visit       Respiratory   OSA (obstructive sleep apnea) - Primary    Chronic  Stable with CPAP use  Patient advised to continue CPAP nightly  The patient has had significant benefit from the use of CPAP machine with considerable improvements in quality of life, work production and decrease medical complaints.  The patient will need continued maintenance and care CPAP supplies (including upgrades as deemed appropriate) in order to continue treatment for sleep apnea as this is medically necessary.           Nervous and Auditory   Essential tremor    Chronic  Becoming more frequent  Recommended propranolol 20mg  twice daily for situations in which tremor is more evident        Relevant Medications   propranolol (INDERAL) 20 MG tablet   Other Relevant Orders   TSH + free T4     Other   Vitamin D deficiency    Chronic  Vitamin d level collected today       Relevant Orders   Vitamin D (25 hydroxy)   Pure hypercholesterolemia    Chronic  Lipid panel collected today        Relevant Medications   sildenafil (VIAGRA) 100 MG tablet   propranolol (INDERAL) 20 MG tablet  Other Relevant Orders   Lipid Profile   Hemoglobin A1c   Erectile dysfunction    Chronic  Stable  Symptoms well controlled on Viagra 100mg   Continue current regimen as needed  Refills provided today  PSA collected        Relevant Medications   sildenafil (VIAGRA) 100 MG tablet   Other Relevant Orders   PSA   Annual physical exam    Chronic conditions are stable  Patient was counseled on benefits of regular physical activity with goal of 150 minutes of moderate to vigurous intensity 4 days per week  Patient was counseled to consume well balanced diet of fruits, vegetables, limited saturated fats and limited sugary foods and beverages with emphasis on consuming 6-8 glasses  of water daily  Screening recommended today: A1c, lipids  Vaccines recommended today: Shingrix (declined), Tetanus adminstered today   Patient is UTD on age appropriate screenings       Overweight (BMI 25.0-29.9)    Chronic BMI 28.88 Patient encouraged to continue exercise aiming for 4-5 days per week for total 150 mins per week  AVS given with dietary recommendations  CMP, A1c, Lipid panel, TSH and free T4 collected today        Relevant Orders   Lipid Profile   Comprehensive metabolic panel   CBC   Hemoglobin A1c   Other Visit Diagnoses     Need for Td vaccine       Relevant Orders   Td : Tetanus/diphtheria >7yo Preservative  free (Completed)        Return in about 6 weeks (around 06/30/2023) for f/u tremor .       The entirety of the information documented in the History of Present Illness, Review of Systems and Physical Exam were personally obtained by me. Portions of this information were initially documented by Sha'taria Tyson,CMA. I, Ronnald Ramp, MD have reviewed the documentation above for thoroughness and accuracy.      Ronnald Ramp, MD  [facility] [phone] (phone) [phone] (fax)  [facility]

## 2023-05-19 NOTE — Assessment & Plan Note (Signed)
Chronic  Vitamin d level collected today

## 2023-05-19 NOTE — Assessment & Plan Note (Signed)
Chronic  Lipid panel collected today

## 2023-05-19 NOTE — Assessment & Plan Note (Signed)
Chronic BMI 28.88 Patient encouraged to continue exercise aiming for 4-5 days per week for total 150 mins per week  AVS given with dietary recommendations  CMP, A1c, Lipid panel, TSH and free T4 collected today

## 2023-05-19 NOTE — Assessment & Plan Note (Signed)
Chronic  Stable  Symptoms well controlled on Viagra 100mg   Continue current regimen as needed  Refills provided today  PSA collected

## 2023-05-19 NOTE — Assessment & Plan Note (Signed)
Chronic conditions are stable  Patient was counseled on benefits of regular physical activity with goal of 150 minutes of moderate to vigurous intensity 4 days per week  Patient was counseled to consume well balanced diet of fruits, vegetables, limited saturated fats and limited sugary foods and beverages with emphasis on consuming 6-8 glasses of water daily  Screening recommended today: A1c, lipids  Vaccines recommended today: Shingrix (declined), Tetanus adminstered today   Patient is UTD on age appropriate screenings

## 2023-05-19 NOTE — Patient Instructions (Addendum)
Please start Propranolol 20mg  (low dose) twice daily for the tremor symptoms.   We will follow up with results of labs once they are available.   Health Maintenance, Male Adopting a healthy lifestyle and getting preventive care are important in promoting health and wellness. Ask your health care provider about: The right schedule for you to have regular tests and exams. Things you can do on your own to prevent diseases and keep yourself healthy. What should I know about diet, weight, and exercise? Eat a healthy diet  Eat a diet that includes plenty of vegetables, fruits, low-fat dairy products, and lean protein. Do not eat a lot of foods that are high in solid fats, added sugars, or sodium. Maintain a healthy weight Body mass index (BMI) is a measurement that can be used to identify possible weight problems. It estimates body fat based on height and weight. Your health care provider can help determine your BMI and help you achieve or maintain a healthy weight. Get regular exercise Get regular exercise. This is one of the most important things you can do for your health. Most adults should: Exercise for at least 150 minutes each week. The exercise should increase your heart rate and make you sweat (moderate-intensity exercise). Do strengthening exercises at least twice a week. This is in addition to the moderate-intensity exercise. Spend less time sitting. Even light physical activity can be beneficial. Watch cholesterol and blood lipids Have your blood tested for lipids and cholesterol at 55 years of age, then have this test every 5 years. You may need to have your cholesterol levels checked more often if: Your lipid or cholesterol levels are high. You are older than 55 years of age. You are at high risk for heart disease. What should I know about cancer screening? Many types of cancers can be detected early and may often be prevented. Depending on your health history and family  history, you may need to have cancer screening at various ages. This may include screening for: Colorectal cancer. Prostate cancer. Skin cancer. Lung cancer. What should I know about heart disease, diabetes, and high blood pressure? Blood pressure and heart disease High blood pressure causes heart disease and increases the risk of stroke. This is more likely to develop in people who have high blood pressure readings or are overweight. Talk with your health care provider about your target blood pressure readings. Have your blood pressure checked: Every 3-5 years if you are 82-88 years of age. Every year if you are 56 years old or older. If you are between the ages of 58 and 87 and are a current or former smoker, ask your health care provider if you should have a one-time screening for abdominal aortic aneurysm (AAA). Diabetes Have regular diabetes screenings. This checks your fasting blood sugar level. Have the screening done: Once every three years after age 85 if you are at a normal weight and have a low risk for diabetes. More often and at a younger age if you are overweight or have a high risk for diabetes. What should I know about preventing infection? Hepatitis B If you have a higher risk for hepatitis B, you should be screened for this virus. Talk with your health care provider to find out if you are at risk for hepatitis B infection. Hepatitis C Blood testing is recommended for: Everyone born from [birth year] through 1963. Anyone with known risk factors for hepatitis C. Sexually transmitted infections (STIs) You should be screened each year for  STIs, including gonorrhea and chlamydia, if: You are sexually active and are younger than 55 years of age. You are older than 55 years of age and your health care provider tells you that you are at risk for this type of infection. Your sexual activity has changed since you were last screened, and you are at increased risk for chlamydia or  gonorrhea. Ask your health care provider if you are at risk. Ask your health care provider about whether you are at high risk for HIV. Your health care provider may recommend a prescription medicine to help prevent HIV infection. If you choose to take medicine to prevent HIV, you should first get tested for HIV. You should then be tested every 3 months for as long as you are taking the medicine. Follow these instructions at home: Alcohol use Do not drink alcohol if your health care provider tells you not to drink. If you drink alcohol: Limit how much you have to 0-2 drinks a day. Know how much alcohol is in your drink. In the [place] one drink equals one 12 oz bottle of beer (355 mL), one 5 oz glass of wine (148 mL), or one 1 oz glass of hard liquor (44 mL). Lifestyle Do not use any products that contain nicotine or tobacco. These products include cigarettes, chewing tobacco, and vaping devices, such as e-cigarettes. If you need help quitting, ask your health care provider. Do not use street drugs. Do not share needles. Ask your health care provider for help if you need support or information about quitting drugs. General instructions Schedule regular health, dental, and eye exams. Stay current with your vaccines. Tell your health care provider if: You often feel depressed. You have ever been abused or do not feel safe at home. Summary Adopting a healthy lifestyle and getting preventive care are important in promoting health and wellness. Follow your health care provider's instructions about healthy diet, exercising, and getting tested or screened for diseases. Follow your health care provider's instructions on monitoring your cholesterol and blood pressure. This information is not intended to replace advice given to you by your health care provider. Make sure you discuss any questions you have with your health care provider. Document Revised: 04/09/2021 Document Reviewed: 04/09/2021 Elsevier  Patient Education  2024 [employer].

## 2023-05-20 LAB — LIPID PANEL
Chol/HDL Ratio: 4.6 ratio (ref 0.0–5.0)
Cholesterol, Total: 219 mg/dL — ABNORMAL HIGH (ref 100–199)
HDL: 48 mg/dL (ref >39)
LDL Chol Calc (NIH): 144 mg/dL — ABNORMAL HIGH (ref 0–99)
Triglycerides: 149 mg/dL (ref 0–149)
VLDL Cholesterol Cal: 27 mg/dL (ref 5–40)

## 2023-05-20 LAB — COMPREHENSIVE METABOLIC PANEL
ALT: 20 IU/L (ref 0–44)
AST: 26 IU/L (ref 0–40)
Albumin: 4.5 g/dL (ref 3.8–4.9)
Alkaline Phosphatase: 94 IU/L (ref 44–121)
BUN/Creatinine Ratio: 9 (ref 9–20)
BUN: 13 mg/dL (ref 6–24)
Bilirubin Total: 1.2 mg/dL (ref 0.0–1.2)
CO2: 26 mmol/L (ref 20–29)
Calcium: 9.2 mg/dL (ref 8.7–10.2)
Chloride: 102 mmol/L (ref 96–106)
Creatinine, Ser: 1.46 mg/dL — ABNORMAL HIGH (ref 0.76–1.27)
Globulin, Total: 2.3 g/dL (ref 1.5–4.5)
Glucose: 114 mg/dL — ABNORMAL HIGH (ref 70–99)
Potassium: 4.6 mmol/L (ref 3.5–5.2)
Sodium: 141 mmol/L (ref 134–144)
Total Protein: 6.8 g/dL (ref 6.0–8.5)
eGFR: 56 mL/min/{1.73_m2} — ABNORMAL LOW (ref >59)

## 2023-05-20 LAB — CBC
Hematocrit: 44.8 % (ref 37.5–51.0)
Hemoglobin: 15 g/dL (ref 13.0–17.7)
MCH: 29.6 pg (ref 26.6–33.0)
MCHC: 33.5 g/dL (ref 31.5–35.7)
MCV: 89 fL (ref 79–97)
Platelets: 161 10*3/uL (ref 150–450)
RBC: 5.06 x10E6/uL (ref 4.14–5.80)
RDW: 12.4 % (ref 11.6–15.4)
WBC: 4.5 10*3/uL (ref 3.4–10.8)

## 2023-05-20 LAB — TSH+FREE T4
Free T4: 1.26 ng/dL (ref 0.82–1.77)
TSH: 3.23 u[IU]/mL (ref 0.450–4.500)

## 2023-05-20 LAB — VITAMIN D 25 HYDROXY (VIT D DEFICIENCY, FRACTURES): Vit D, 25-Hydroxy: 36.7 ng/mL (ref 30.0–100.0)

## 2023-05-20 LAB — HEMOGLOBIN A1C
Est. average glucose Bld gHb Est-mCnc: 114 mg/dL
Hgb A1c MFr Bld: 5.6 % (ref 4.8–5.6)

## 2023-05-20 LAB — PSA: Prostate Specific Ag, Serum: 1.3 ng/mL (ref 0.0–4.0)

## 2023-10-02 ENCOUNTER — Encounter: Payer: Self-pay | Admitting: Family Medicine

## 2023-10-02 ENCOUNTER — Ambulatory Visit: Payer: Managed Care, Other (non HMO) | Admitting: Family Medicine

## 2023-10-02 VITALS — BP 134/84 | HR 94 | Temp 98.1°F | Ht 71.0 in | Wt 206.0 lb

## 2023-10-02 DIAGNOSIS — B9689 Other specified bacterial agents as the cause of diseases classified elsewhere: Secondary | ICD-10-CM | POA: Diagnosis not present

## 2023-10-02 DIAGNOSIS — J019 Acute sinusitis, unspecified: Secondary | ICD-10-CM | POA: Diagnosis not present

## 2023-10-02 MED ORDER — AMOXICILLIN 500 MG PO CAPS
500.0000 mg | ORAL_CAPSULE | Freq: Three times a day (TID) | ORAL | 0 refills | Status: AC
Start: 2023-10-02 — End: 2023-10-09

## 2023-10-02 NOTE — Progress Notes (Signed)
Established patient visit   Patient: Douglas Coffey   DOB: 16-Oct-1966   57 y.o. Male  MRN: [id number] Visit Date: 10/02/2023  Today's healthcare provider: Ronnald Ramp, MD   Chief Complaint  Patient presents with   Sinusitis    Patient has had chest congestion for about 2 weeks.  He has had head congestion and sinus symptoms for about 5 days.  He states his head is full, he has been getting nose bleeds and has sinus pain and pressure.     Subjective     HPI     Sinusitis    Additional comments: Patient has had chest congestion for about 2 weeks.  He has had head congestion and sinus symptoms for about 5 days.  He states his head is full, he has been getting nose bleeds and has sinus pain and pressure.        Last edited by Ronnald Ramp, MD on 10/02/2023  4:26 PM.       Discussed the use of AI scribe software for clinical note transcription with the patient, who gave verbal consent to proceed.  History of Present Illness   The patient, a 57 year old male, presents with a two-week history of chest and head congestion, which has been accompanied by sinus symptoms for the past five days. The patient describes a sensation of fullness in the head, sinus pain and pressure, and has experienced nosebleeds. The patient also reports a thick, yellow mucus that he has been coughing up, particularly in the last five to seven days. The patient has been experiencing headaches that come and go, and notes that over-the-counter medications such as Mucinex DM and Sudafed have provided some relief. The patient has also experienced a sore throat, which has since resolved, and occasional feelings of ear congestion. The patient denies having a runny nose, but notes that the mucus is thick when he blows his nose. The patient has been taking Zyrtec for symptom relief. The patient also reports a brief period of feeling better, followed by a worsening of symptoms, including a  nosebleed and lightheadedness. The patient has experienced occasional shortness of breath and reports a previous episode of wheezing. The patient denies any known allergies to antibiotics.         Past Medical History:  Diagnosis Date   Hyperlipidemia    Sleep apnea    CPAP   Vitamin D deficiency     Medications: Outpatient Medications Prior to Visit  Medication Sig   cetirizine (ZYRTEC) 10 MG tablet Take 10 mg by mouth daily.   CINNAMON PO Take by mouth daily. With Chromium   Multiple Vitamin (MULTIVITAMIN) tablet Take 1 tablet by mouth daily.   Omega-3 Fatty Acids (FISH OIL) 1200 MG CAPS Take 2 capsules by mouth daily.   OVER THE COUNTER MEDICATION daily. Beets   propranolol (INDERAL) 20 MG tablet Take 1 tablet (20 mg total) by mouth 2 (two) times daily.   psyllium (METAMUCIL) 58.6 % powder Take 1 packet by mouth 2 (two) times daily.   sildenafil (VIAGRA) 100 MG tablet Take 1 tablet (100 mg total) by mouth daily as needed for erectile dysfunction.   No facility-administered medications prior to visit.    Review of Systems  Last CBC Lab Results  Component Value Date   WBC 4.5 05/19/2023   HGB 15.0 05/19/2023   HCT 44.8 05/19/2023   MCV 89 05/19/2023   MCH 29.6 05/19/2023   RDW 12.4 05/19/2023   PLT 161  05/19/2023   Last metabolic panel Lab Results  Component Value Date   GLUCOSE 114 (H) 05/19/2023   NA 141 05/19/2023   K 4.6 05/19/2023   CL 102 05/19/2023   CO2 26 05/19/2023   BUN 13 05/19/2023   CREATININE 1.46 (H) 05/19/2023   EGFR 56 (L) 05/19/2023   CALCIUM 9.2 05/19/2023   PROT 6.8 05/19/2023   ALBUMIN 4.5 05/19/2023   LABGLOB 2.3 05/19/2023   AGRATIO 1.9 05/19/2020   BILITOT 1.2 05/19/2023   ALKPHOS 94 05/19/2023   AST 26 05/19/2023   ALT 20 05/19/2023        Objective    BP 134/84 (BP Location: Right Arm, Patient Position: Sitting, Cuff Size: Normal)   Pulse 94   Temp 98.1 F (36.7 C) (Oral)   Ht 5\' 11"  (1.803 m)   Wt 206 lb (93.4 kg)    SpO2 95%   BMI 28.73 kg/m  BP Readings from Last 3 Encounters:  10/02/23 134/84  05/19/23 132/89  06/07/22 117/77   Wt Readings from Last 3 Encounters:  10/02/23 206 lb (93.4 kg)  05/19/23 207 lb 1.6 oz (93.9 kg)  06/07/22 205 lb (93 kg)          Physical Exam  Physical Exam   VITALS: T- 98.1, P- 94, SaO2- 95% HEENT: Ears congested without fluid or redness. Nasal passages and mucosa erythematous and irritated. Throat erythematous without exudate. maxillary sinus tenderness left>right NECK: Enlarged lymph nodes. CHEST: Breath sounds quiet, no wheezes or crackles.       No results found for any visits on 10/02/23.  Assessment & Plan     Problem List Items Addressed This Visit   None Visit Diagnoses     Acute bacterial sinusitis    -  Primary   Relevant Medications   amoxicillin (AMOXIL) 500 MG capsule          Acute Bacterial Rhinosinusitis Presents with two weeks of chest and head congestion, sinus symptoms, nosebleeds, sinus pain and pressure, and thick mucus. No ear pain or runny nose. Nasal passages are erythematous and irritated. -Prescribe Amoxicillin 500mg  three times daily for a seven-day course. -Recommend nasal saline flush for nasal mucosa irritation. -Advise patient to drink at least 60 ounces of water per day while on antibiotics to maintain hydration and prevent onset of diarrhea. -Advise patient to seek immediate care if experiencing shortness of breath causing lightheadedness or dizziness, or chest pain with breathing.         No follow-ups on file.         Ronnald Ramp, MD  [facility] [phone] (phone) [phone] (fax)  [facility]

## 2023-10-02 NOTE — Patient Instructions (Signed)
VISIT SUMMARY:  Today, you were seen for a two-week history of chest and head congestion, sinus symptoms, and nosebleeds. You have been experiencing sinus pain and pressure, thick yellow mucus, and occasional shortness of breath. Over-the-counter medications have provided some relief, but your symptoms have worsened recently.  YOUR PLAN:  -ACUTE BACTERIAL RHINOSINUSITIS: Acute Bacterial Rhinosinusitis is an infection of the sinuses caused by bacteria, leading to symptoms like congestion, sinus pain, and thick mucus. You have been prescribed Amoxicillin 500mg  to be taken three times daily for seven days. Additionally, you should use a nasal saline flush to help with nasal irritation and drink at least 60 ounces of water daily to stay hydrated and prevent diarrhea. Seek immediate care if you experience shortness of breath causing lightheadedness or dizziness, or chest pain with breathing.  INSTRUCTIONS:  Please follow the prescribed course of Amoxicillin and use the nasal saline flush as recommended. Ensure you drink at least 60 ounces of water daily. Seek immediate care if you experience shortness of breath causing lightheadedness or dizziness, or chest pain with breathing.

## 2024-06-23 ENCOUNTER — Encounter: Payer: Self-pay | Admitting: Family Medicine

## 2024-06-23 ENCOUNTER — Ambulatory Visit: Admitting: Family Medicine

## 2024-06-23 VITALS — BP 126/86 | HR 83 | Ht 71.0 in | Wt 190.0 lb

## 2024-06-23 DIAGNOSIS — Z130 Encounter for screening for diseases of the blood and blood-forming organs and certain disorders involving the immune mechanism: Secondary | ICD-10-CM

## 2024-06-23 DIAGNOSIS — E7800 Pure hypercholesterolemia, unspecified: Secondary | ICD-10-CM | POA: Diagnosis not present

## 2024-06-23 DIAGNOSIS — Z23 Encounter for immunization: Secondary | ICD-10-CM | POA: Diagnosis not present

## 2024-06-23 DIAGNOSIS — G4733 Obstructive sleep apnea (adult) (pediatric): Secondary | ICD-10-CM | POA: Diagnosis not present

## 2024-06-23 DIAGNOSIS — E559 Vitamin D deficiency, unspecified: Secondary | ICD-10-CM

## 2024-06-23 DIAGNOSIS — Z0000 Encounter for general adult medical examination without abnormal findings: Secondary | ICD-10-CM

## 2024-06-23 DIAGNOSIS — E663 Overweight: Secondary | ICD-10-CM

## 2024-06-23 DIAGNOSIS — N529 Male erectile dysfunction, unspecified: Secondary | ICD-10-CM

## 2024-06-23 NOTE — Progress Notes (Signed)
 Complete physical exam   Patient: Douglas Coffey   DOB: [birth year]   58 y.o. Male  MRN: [id number] Visit Date: 06/23/2024  Today's healthcare provider: Rockie Agent, MD   Chief Complaint  Patient presents with   Annual Exam   Care Management    Pattern of eating:General   Are you exercising:Yes  What type of exercising:walking  How long:45  How frequent: 4 times a week     Vaccine: yes     Subjective    Douglas Coffey is a 58 y.o. male who presents today for a complete physical exam.    He does not have additional problems to discuss today.   Discussed the use of AI scribe software for clinical note transcription with the patient, who gave verbal consent to proceed.  History of Present Illness Douglas Coffey is a 58 year old male who presents for an annual physical examination.  He is requesting Shingrix  and hepatitis B vaccinations. He has a history of erectile dysfunction and is on a prescription for Viagra . He is not currently followed by a urologist for prostate levels.  He has a history of vitamin D  deficiency and is interested in having his vitamin D  levels checked. He has been managing his health by losing weight, which has improved his sleep apnea symptoms. He reports a weight loss from 208 pounds in February to 190 pounds currently, achieved through dietary changes, increased physical activity, and drinking more water . He occasionally uses CPAP for sleep apnea but notes improvement with weight loss.  He is currently exercising 45 minutes four times a week and maintaining a general diet.  He mentions experiencing more tremors recently and is considering starting propranolol , which was previously prescribed but not taken.     Past Medical History:  Diagnosis Date   Hyperlipidemia    Sleep apnea    CPAP   Vitamin D  deficiency    Past Surgical History:  Procedure Laterality Date   COLONOSCOPY WITH PROPOFOL  N/A 06/27/2020   Procedure:  COLONOSCOPY WITH PROPOFOL ;  Surgeon: Janalyn Keene NOVAK, MD;  Location: [facility];  Service: Endoscopy;  Laterality: N/A;  priority 4 sleep apnea   POLYPECTOMY N/A 06/27/2020   Procedure: POLYPECTOMY;  Surgeon: Janalyn Keene NOVAK, MD;  Location: [facility];  Service: Endoscopy;  Laterality: N/A;   TOOTH EXTRACTION     Social History   Socioeconomic History   Marital status: Married    Spouse name: Not on file   Number of children: Not on file   Years of education: Not on file   Highest education level: Not on file  Occupational History   Not on file  Tobacco Use   Smoking status: Never   Smokeless tobacco: Never  Vaping Use   Vaping status: Never Used  Substance and Sexual Activity   Alcohol use: No    Alcohol/week: 0.0 standard drinks of alcohol   Drug use: No   Sexual activity: Yes  Other Topics Concern   Not on file  Social History Narrative   Not on file   Social Drivers of Health   Financial Resource Strain: Low Risk  (06/23/2024)   Overall Financial Resource Strain (CARDIA)    Difficulty of Paying Living Expenses: Not hard at all  Food Insecurity: No Food Insecurity (06/23/2024)   Hunger Vital Sign    Worried About Running Out of Food in the Last Year: Never true    Ran Out of Food in the Last Year: Never  true  Transportation Needs: No Transportation Needs (06/23/2024)   PRAPARE - Administrator, Civil Service (Medical): No    Lack of Transportation (Non-Medical): No  Physical Activity: Not on file  Stress: No Stress Concern Present (06/23/2024)   [employer] of Occupational Health - Occupational Stress Questionnaire    Feeling of Stress: Not at all  Social Connections: Not on file  Intimate Partner Violence: Not At Risk (06/23/2024)   Humiliation, Afraid, Rape, and Kick questionnaire    Fear of Current or Ex-Partner: No    Emotionally Abused: No    Physically Abused: No    Sexually Abused: No   Family Status  Relation  Name Status   Mother  Alive   Father  Alive   MGM  Alive   MGF  Deceased   PGM  Deceased   PGF  Deceased   Brother  Alive   Daughter  Alive  No partnership data on file   Family History  Problem Relation Age of Onset   Diabetes Mother    Healthy Brother    Healthy Daughter    No Known Allergies   Medications: Outpatient Medications Prior to Visit  Medication Sig   cetirizine (ZYRTEC) 10 MG tablet Take 10 mg by mouth daily.   CINNAMON PO Take by mouth daily. With Chromium   Multiple Vitamin (MULTIVITAMIN) tablet Take 1 tablet by mouth daily.   Omega-3 Fatty Acids (FISH OIL) 1200 MG CAPS Take 2 capsules by mouth daily.   OVER THE COUNTER MEDICATION daily. Beets   propranolol  (INDERAL ) 20 MG tablet Take 1 tablet (20 mg total) by mouth 2 (two) times daily.   psyllium (METAMUCIL) 58.6 % powder Take 1 packet by mouth 2 (two) times daily.   sildenafil  (VIAGRA ) 100 MG tablet Take 1 tablet (100 mg total) by mouth daily as needed for erectile dysfunction.   No facility-administered medications prior to visit.    Review of Systems  Last CBC Lab Results  Component Value Date   WBC 4.5 05/19/2023   HGB 15.0 05/19/2023   HCT 44.8 05/19/2023   MCV 89 05/19/2023   MCH 29.6 05/19/2023   RDW 12.4 05/19/2023   PLT 161 05/19/2023   Last metabolic panel Lab Results  Component Value Date   GLUCOSE 114 (H) 05/19/2023   NA 141 05/19/2023   K 4.6 05/19/2023   CL 102 05/19/2023   CO2 26 05/19/2023   BUN 13 05/19/2023   CREATININE 1.46 (H) 05/19/2023   EGFR 56 (L) 05/19/2023   CALCIUM 9.2 05/19/2023   PROT 6.8 05/19/2023   ALBUMIN 4.5 05/19/2023   LABGLOB 2.3 05/19/2023   AGRATIO 1.9 05/19/2020   BILITOT 1.2 05/19/2023   ALKPHOS 94 05/19/2023   AST 26 05/19/2023   ALT 20 05/19/2023   Last lipids Lab Results  Component Value Date   CHOL 219 (H) 05/19/2023   HDL 48 05/19/2023   LDLCALC 144 (H) 05/19/2023   TRIG 149 05/19/2023   CHOLHDL 4.6 05/19/2023   Last hemoglobin  A1c Lab Results  Component Value Date   HGBA1C 5.6 05/19/2023   Last thyroid functions Lab Results  Component Value Date   TSH 3.230 05/19/2023   Last vitamin D  Lab Results  Component Value Date   VD25OH 36.7 05/19/2023   Last vitamin B12 and Folate No results found for: VITAMINB12, FOLATE   {See past labs  Heme  Chem  Endocrine  Serology  Results Review (optional):1}  Objective  BP 126/86   Pulse 83   Ht 5' 11 (1.803 m)   Wt 190 lb (86.2 kg)   SpO2 100%   BMI 26.50 kg/m   BP Readings from Last 3 Encounters:  06/23/24 126/86  10/02/23 134/84  05/19/23 132/89   Wt Readings from Last 3 Encounters:  06/23/24 190 lb (86.2 kg)  10/02/23 206 lb (93.4 kg)  05/19/23 207 lb 1.6 oz (93.9 kg)    {See vitals history (optional):1}    Physical Exam Constitutional:      General: He is not in acute distress.    Appearance: Normal appearance. He is not ill-appearing.  HENT:     Mouth/Throat:     Mouth: Mucous membranes are moist.  Eyes:     General: No scleral icterus.       Right eye: No discharge.        Left eye: No discharge.     Extraocular Movements: Extraocular movements intact.     Conjunctiva/sclera: Conjunctivae normal.     Pupils: Pupils are equal, round, and reactive to light.  Cardiovascular:     Rate and Rhythm: Normal rate and regular rhythm.     Heart sounds: Normal heart sounds.  Pulmonary:     Effort: Pulmonary effort is normal.     Breath sounds: Normal breath sounds.  Abdominal:     General: Bowel sounds are normal. There is no distension.     Palpations: Abdomen is soft.     Tenderness: There is no abdominal tenderness.  Musculoskeletal:        General: No deformity or signs of injury. Normal range of motion.     Cervical back: Normal range of motion and neck supple. No tenderness.     Right lower leg: No edema.     Left lower leg: No edema.  Lymphadenopathy:     Cervical: No cervical adenopathy.  Neurological:     Mental  Status: He is alert and oriented to person, place, and time.     Cranial Nerves: No cranial nerve deficit.     Motor: No weakness.     Gait: Gait normal.  Psychiatric:        Mood and Affect: Mood normal.        Behavior: Behavior normal.       Last depression screening scores    06/23/2024    2:23 PM 10/02/2023    4:15 PM 05/19/2023    8:16 AM  PHQ 2/9 Scores  PHQ - 2 Score 1 1 1   PHQ- 9 Score 4 5 3     Last fall risk screening    10/02/2023    4:15 PM  Fall Risk   Falls in the past year? 0  Number falls in past yr: 0  Injury with Fall? 0    Last Audit-C alcohol use screening    06/23/2024    2:14 PM  Alcohol Use Disorder Test (AUDIT)  1. How often do you have a drink containing alcohol? 0  2. How many drinks containing alcohol do you have on a typical day when you are drinking? 0  3. How often do you have six or more drinks on one occasion? 0  AUDIT-C Score 0   A score of 3 or more in women, and 4 or more in men indicates increased risk for alcohol abuse, EXCEPT if all of the points are from question 1   No results found for any visits on 06/23/24.  Assessment & Plan  Routine Health Maintenance and Physical Exam  Immunization History  Administered Date(s) Administered   MMR 06/22/1999   Moderna Covid-19 Fall Seasonal Vaccine 29yrs & older 01/02/2023   Moderna Sars-Covid-2 Vaccination 02/11/2020, 03/14/2020   PFIZER(Purple Top)SARS-COV-2 Vaccination 11/22/2020   Pfizer(Comirnaty)Fall Seasonal Vaccine 12 years and older 01/02/2023   Td 05/19/2023   Tdap 11/23/2009    Health Maintenance  Topic Date Due   Hepatitis B Vaccines (1 of 3 - 19+ 3-dose series) Never done   Zoster Vaccines- Shingrix  (1 of 2) Never done   COVID-19 Vaccine (5 - 2024-25 season) 08/03/2023   INFLUENZA VACCINE  07/02/2024   Colonoscopy  06/27/2025   DTaP/Tdap/Td (3 - Td or Tdap) 05/18/2033   Hepatitis C Screening  Completed   HIV Screening  Completed   HPV VACCINES  Aged Out    Meningococcal B Vaccine  Aged Out    Problem List Items Addressed This Visit       Respiratory   OSA (obstructive sleep apnea)     Other   Vitamin D  deficiency   Relevant Orders   VITAMIN D  25 Hydroxy (Vit-D Deficiency, Fractures)   Pure hypercholesterolemia   Overweight (BMI 25.0-29.9)   Relevant Orders   Hemoglobin A1c   CMP14+EGFR   Lipid panel   Erectile dysfunction   Relevant Orders   PSA   Annual physical exam - Primary   Other Visit Diagnoses       Screening for deficiency anemia       Relevant Orders   CBC       Assessment & Plan Annual Physical Examination 58 year old male undergoing routine annual physical examination with erectile dysfunction and vitamin D  deficiency. No acute complaints. - Order A1c, CMP, PSA, CBC, and lipid panel - Administer Shingrix  and hepatitis B vaccinations - Check vitamin D  levels - Provide results via MyChart and contact if any abnormalities are found  Erectile Dysfunction Erectile dysfunction managed with Viagra . PSA monitoring recommended to ensure medication efficacy. - Order PSA to monitor prostate levels  Vitamin D  Deficiency Vitamin D  deficiency with a request for level check. - Check vitamin D  levels  General Health Maintenance Maintaining a healthy lifestyle with a balanced diet and regular exercise. Reports weight loss from 208 lbs in February to 190 lbs currently. Reduced CPAP use due to weight loss. - Encourage continuation of current diet and exercise regimen       No follow-ups on file.       Rockie Agent, MD  [facility] [phone] (phone) [phone] (fax)  [facility]

## 2024-06-24 LAB — CBC
Hematocrit: 45.5 % (ref 37.5–51.0)
Hemoglobin: 15 g/dL (ref 13.0–17.7)
MCH: 30.1 pg (ref 26.6–33.0)
MCHC: 33 g/dL (ref 31.5–35.7)
MCV: 91 fL (ref 79–97)
Platelets: 191 x10E3/uL (ref 150–450)
RBC: 4.98 x10E6/uL (ref 4.14–5.80)
RDW: 12.2 % (ref 11.6–15.4)
WBC: 5.3 x10E3/uL (ref 3.4–10.8)

## 2024-06-24 LAB — LIPID PANEL
Chol/HDL Ratio: 4.4 ratio (ref 0.0–5.0)
Cholesterol, Total: 213 mg/dL — ABNORMAL HIGH (ref 100–199)
HDL: 48 mg/dL (ref >39)
LDL Chol Calc (NIH): 142 mg/dL — ABNORMAL HIGH (ref 0–99)
Triglycerides: 126 mg/dL (ref 0–149)
VLDL Cholesterol Cal: 23 mg/dL (ref 5–40)

## 2024-06-24 LAB — CMP14+EGFR
ALT: 14 IU/L (ref 0–44)
AST: 17 IU/L (ref 0–40)
Albumin: 4.7 g/dL (ref 3.8–4.9)
Alkaline Phosphatase: 87 IU/L (ref 44–121)
BUN/Creatinine Ratio: 13 (ref 9–20)
BUN: 17 mg/dL (ref 6–24)
Bilirubin Total: 1 mg/dL (ref 0.0–1.2)
CO2: 20 mmol/L (ref 20–29)
Calcium: 9 mg/dL (ref 8.7–10.2)
Chloride: 103 mmol/L (ref 96–106)
Creatinine, Ser: 1.33 mg/dL — ABNORMAL HIGH (ref 0.76–1.27)
Globulin, Total: 2.4 g/dL (ref 1.5–4.5)
Glucose: 79 mg/dL (ref 70–99)
Potassium: 4.3 mmol/L (ref 3.5–5.2)
Sodium: 145 mmol/L — ABNORMAL HIGH (ref 134–144)
Total Protein: 7.1 g/dL (ref 6.0–8.5)
eGFR: 63 mL/min/1.73 (ref >59)

## 2024-06-24 LAB — VITAMIN D 25 HYDROXY (VIT D DEFICIENCY, FRACTURES): Vit D, 25-Hydroxy: 30.6 ng/mL (ref 30.0–100.0)

## 2024-06-24 LAB — HEMOGLOBIN A1C
Est. average glucose Bld gHb Est-mCnc: 100 mg/dL
Hgb A1c MFr Bld: 5.1 % (ref 4.8–5.6)

## 2024-06-24 LAB — PSA: Prostate Specific Ag, Serum: 1.4 ng/mL (ref 0.0–4.0)

## 2024-06-28 ENCOUNTER — Ambulatory Visit: Payer: Self-pay | Admitting: Family Medicine

## 2024-06-29 ENCOUNTER — Other Ambulatory Visit: Payer: Self-pay | Admitting: Family Medicine

## 2024-06-29 DIAGNOSIS — E7800 Pure hypercholesterolemia, unspecified: Secondary | ICD-10-CM

## 2024-06-29 MED ORDER — ATORVASTATIN CALCIUM 20 MG PO TABS
20.0000 mg | ORAL_TABLET | Freq: Every day | ORAL | 3 refills | Status: AC
Start: 2024-06-29 — End: ?

## 2024-08-03 ENCOUNTER — Ambulatory Visit (INDEPENDENT_AMBULATORY_CARE_PROVIDER_SITE_OTHER): Admitting: Family Medicine

## 2024-08-03 ENCOUNTER — Encounter: Payer: Self-pay | Admitting: Family Medicine

## 2024-08-03 VITALS — BP 124/85 | HR 73 | Resp 16 | Ht 71.0 in | Wt 185.9 lb

## 2024-08-03 DIAGNOSIS — E7800 Pure hypercholesterolemia, unspecified: Secondary | ICD-10-CM

## 2024-08-03 DIAGNOSIS — G4733 Obstructive sleep apnea (adult) (pediatric): Secondary | ICD-10-CM

## 2024-08-03 DIAGNOSIS — R3912 Poor urinary stream: Secondary | ICD-10-CM | POA: Diagnosis not present

## 2024-08-03 DIAGNOSIS — N529 Male erectile dysfunction, unspecified: Secondary | ICD-10-CM

## 2024-08-03 DIAGNOSIS — G250 Essential tremor: Secondary | ICD-10-CM | POA: Diagnosis not present

## 2024-08-03 DIAGNOSIS — R350 Frequency of micturition: Secondary | ICD-10-CM

## 2024-08-03 LAB — POCT URINALYSIS DIPSTICK
Bilirubin, UA: NEGATIVE
Blood, UA: NEGATIVE
Glucose, UA: NEGATIVE
Ketones, UA: NEGATIVE
Leukocytes, UA: NEGATIVE
Nitrite, UA: NEGATIVE
Protein, UA: NEGATIVE
Spec Grav, UA: 1.01 (ref 1.010–1.025)
Urobilinogen, UA: 0.2 U/dL
pH, UA: 6 (ref 5.0–8.0)

## 2024-08-03 MED ORDER — TADALAFIL 20 MG PO TABS: 10.0000 mg | ORAL_TABLET | ORAL | 11 refills | Status: DC | PRN

## 2024-08-03 MED ORDER — TAMSULOSIN HCL 0.4 MG PO CAPS: 0.4000 mg | ORAL_CAPSULE | Freq: Every day | ORAL | 3 refills | Status: DC

## 2024-08-03 NOTE — Progress Notes (Unsigned)
 ACUTE VISIT   Patient: Douglas Coffey   DOB: 17-Dec-1965   58 y.o. Male  MRN: [id number]   PCP: Sharma Coyer, MD  Chief Complaint  Patient presents with   Urinary Frequency    Symptoms: frequency, weak stream, affecting sexual life.    Subjective    HPI HPI     Urinary Frequency    Additional comments: Symptoms: frequency, weak stream, affecting sexual life.       Last edited by Rosas, Joseline E, CMA on 08/03/2024  2:29 PM.       Discussed the use of AI scribe software for clinical note transcription with the patient, who gave verbal consent to proceed.  History of Present Illness Douglas Coffey is a 58 year old male who presents with urinary frequency and erectile dysfunction.  He has experienced increased urinary frequency over the past couple of weeks, occurring both day and night. The urinary stream is very weak, often resulting in dribbling, and there is a sensation of pressure. The urge to urinate sometimes results in only a dribble after waiting two to three minutes. No pain is associated with urination.  He also experiences erectile dysfunction that has not improved with the use of Viagra , noting a weak effect and a strange feeling of pressure during attempted intercourse. He has tried Viagra  twice recently without significant improvement.  He has a history of hyperlipidemia and is currently on atorvastatin  20 mg for cholesterol management. He is concerned about the impact of his medications on his kidney function, as his creatinine level was 1.3 mg/dL two months ago, and his GFR has improved from 59 to 63 mL/min/1.73 m over the past year. He is not currently taking propranolol , which was prescribed for essential tremor, as he is managing the tremor without medication.  He has made lifestyle changes, including weight loss from 206 lbs to 185 lbs, increased physical activity, and dietary modifications such as reducing meat intake and incorporating more  plant-based proteins and oatmeal. He is cautious about his kidney health, avoiding NSAIDs and monitoring his hydration and protein intake.  He has a history of obstructive sleep apnea and previously used a CPAP machine, but has not used it recently due to weight loss and reduced snoring. He is interested in an updated sleep study to reassess his need for CPAP therapy.     Medications: Outpatient Medications Prior to Visit  Medication Sig   atorvastatin  (LIPITOR) 20 MG tablet Take 1 tablet (20 mg total) by mouth daily.   cetirizine (ZYRTEC) 10 MG tablet Take 10 mg by mouth daily.   CINNAMON PO Take by mouth daily. With Chromium   Multiple Vitamin (MULTIVITAMIN) tablet Take 1 tablet by mouth daily.   Omega-3 Fatty Acids (FISH OIL) 1200 MG CAPS Take 2 capsules by mouth daily.   OVER THE COUNTER MEDICATION daily. Beets   propranolol  (INDERAL ) 20 MG tablet Take 1 tablet (20 mg total) by mouth 2 (two) times daily.   psyllium (METAMUCIL) 58.6 % powder Take 1 packet by mouth 2 (two) times daily.   sildenafil  (VIAGRA ) 100 MG tablet Take 1 tablet (100 mg total) by mouth daily as needed for erectile dysfunction.   No facility-administered medications prior to visit.    Last metabolic panel Lab Results  Component Value Date   GLUCOSE 79 06/23/2024   NA 145 (H) 06/23/2024   K 4.3 06/23/2024   CL 103 06/23/2024   CO2 20 06/23/2024   BUN 17 06/23/2024  CREATININE 1.33 (H) 06/23/2024   EGFR 63 06/23/2024   CALCIUM  9.0 06/23/2024   PROT 7.1 06/23/2024   ALBUMIN 4.7 06/23/2024   LABGLOB 2.4 06/23/2024   AGRATIO 1.9 05/19/2020   BILITOT 1.0 06/23/2024   ALKPHOS 87 06/23/2024   AST 17 06/23/2024   ALT 14 06/23/2024   Last lipids Lab Results  Component Value Date   CHOL 213 (H) 06/23/2024   HDL 48 06/23/2024   LDLCALC 142 (H) 06/23/2024   TRIG 126 06/23/2024   CHOLHDL 4.4 06/23/2024   The 10-year ASCVD risk score (Arnett DK, et al., 2019) is: 6.8%   Last hemoglobin A1c Lab  Results  Component Value Date   HGBA1C 5.1 06/23/2024     Lab Results  Component Value Date   PSA1 1.4 06/23/2024   PSA1 1.3 05/19/2023   PSA1 1.0 05/19/2020     {See past labs  Heme  Chem  Endocrine  Serology  Results Review (optional):1}   Objective    BP 124/85 (BP Location: Right Arm, Patient Position: Sitting, Cuff Size: Normal)   Pulse 73   Resp 16   Ht 5' 11 (1.803 m)   Wt 185 lb 14.4 oz (84.3 kg)   SpO2 100%   BMI 25.93 kg/m   BP Readings from Last 3 Encounters:  08/03/24 124/85  06/23/24 126/86  10/02/23 134/84   Wt Readings from Last 3 Encounters:  08/03/24 185 lb 14.4 oz (84.3 kg)  06/23/24 190 lb (86.2 kg)  10/02/23 206 lb (93.4 kg)    {See vitals history (optional):1}  Physical Exam   Physical Exam MEASUREMENTS: Weight- 185. CHEST: Clear to auscultation bilaterally. No wheezes, rhonchi, or crackles. EXTREMITIES: No cyanosis or edema.   Results for orders placed or performed in visit on 08/03/24  POCT urinalysis dipstick  Result Value Ref Range   Color, UA Yellow    Clarity, UA Clear    Glucose, UA Negative Negative   Bilirubin, UA Negative    Ketones, UA Negative    Spec Grav, UA 1.010 1.010 - 1.025   Blood, UA Negative    pH, UA 6.0 5.0 - 8.0   Protein, UA Negative Negative   Urobilinogen, UA 0.2 0.2 or 1.0 E.U./dL   Nitrite, UA Negative    Leukocytes, UA Negative Negative   Appearance     Odor      Assessment & Plan     Assessment and Plan Assessment & Plan Urinary frequency and poor urinary stream Increased urinary frequency and weak urinary stream with dribbling, suggestive of possible urological issues. Differential diagnosis includes urinary tract infection, kidney stones, or gallbladder pressure, though no pain is reported. Urinalysis does not indicate a clear urinary tract infection. - Prescribe Flomax  0.4 mg once daily to improve urinary stream. - Refer to urology for further evaluation.  Erectile  dysfunction Erectile dysfunction not responsive to Viagra , with associated pressure sensation during attempted erection. Possible need for alternative phosphodiesterase type 5 inhibitor. - Prescribe Cialis  as an alternative to Viagra . - Refer to urology for further evaluation.  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 Creatinine level at 1.3, possibly due to muscle mass. No current use of NSAIDs or antibiotics that could affect kidney function. Discussed potential use of medications like losartan for kidney protection, but deferred decision. - Order complete metabolic panel to monitor kidney function. - Provide educational materials on kidney protection medications for review.  Pure hypercholesterolemia Currently managed with atorvastatin  (Lipitor) 20 mg. ASCVD risk score is 6.8%, indicating moderate risk. - Order lipid panel to monitor cholesterol levels.  Obstructive sleep apnea Obstructive sleep apnea previously managed with CPAP. Reports of weight loss and decreased snoring, suggesting possible improvement in symptoms. No recent follow-up with sleep medicine specialist. - Order sleep study to reassess current status of obstructive sleep apnea.  Follow-Up Scheduled follow-up appointment on August 25, 2024. Blood tests (complete metabolic panel and lipid panel) to be done a week before the follow-up appointment. - Schedule follow-up appointment on August 25, 2024. - Order blood tests (complete metabolic panel and lipid panel) to be done a week before the follow-up appointment.      No follow-ups on file.        Rockie Agent, MD  [facility] [phone] (phone) [phone] (fax)  [facility]

## 2024-08-03 NOTE — Patient Instructions (Addendum)
 Please review the attached documents regarding the potential medications that would be protective of your kidneys if function has changed    VISIT SUMMARY: During your visit, we discussed your increased urinary frequency, weak urinary stream, and erectile dysfunction. We also reviewed your chronic kidney disease, cholesterol management, and obstructive sleep apnea. We have made some changes to your medications and planned further evaluations.  YOUR PLAN: URINARY FREQUENCY AND POOR URINARY STREAM: You have been experiencing increased urinary frequency and a weak urinary stream with dribbling. -Start taking Flomax  0.4 mg once daily to improve your urinary stream. -We will refer you to a urologist for further evaluation.  ERECTILE DYSFUNCTION: You have erectile dysfunction that has not improved with Viagra . -We will prescribe Cialis  as an alternative to Viagra . -We will refer you to a urologist for further evaluation.  CHRONIC KIDNEY DISEASE STAGE 2: You have stage 2 chronic kidney disease with a recent GFR of 63, which has improved from 59 last year. -We will order a complete metabolic panel to monitor your kidney function. -We will provide you with educational materials on medications that can protect your kidneys.  PURE HYPERCHOLESTEROLEMIA: Your cholesterol is currently managed with atorvastatin  (Lipitor) 20 mg. -We will order a lipid panel to monitor your cholesterol levels.  OBSTRUCTIVE SLEEP APNEA: You have a history of obstructive sleep apnea and have experienced weight loss and decreased snoring. -We will order a sleep study to reassess your current status.  FOLLOW-UP: We need to monitor your progress and perform necessary tests before your next visit. -Schedule a follow-up appointment on August 25, 2024. -Get blood tests (complete metabolic panel and lipid panel) done a week before your follow-up appointment.                      Contains text generated by  [employer]bridge.                                 Contains text generated by [employer].

## 2024-08-05 ENCOUNTER — Ambulatory Visit: Payer: Self-pay | Admitting: Family Medicine

## 2024-08-05 LAB — URINE CULTURE: Organism ID, Bacteria: NO GROWTH

## 2024-08-12 ENCOUNTER — Other Ambulatory Visit: Payer: Self-pay

## 2024-08-12 DIAGNOSIS — R39198 Other difficulties with micturition: Secondary | ICD-10-CM

## 2024-08-18 ENCOUNTER — Telehealth: Payer: Self-pay | Admitting: Family Medicine

## 2024-08-18 DIAGNOSIS — R7989 Other specified abnormal findings of blood chemistry: Secondary | ICD-10-CM

## 2024-08-18 NOTE — Telephone Encounter (Signed)
 CMP ordered.

## 2024-08-18 NOTE — Telephone Encounter (Signed)
 Copied from CRM [id number]. Topic: General - Other >> Aug 18, 2024  8:59 AM Tiffini S wrote: Reason for CRM: Patient has a appointment- called asking for orders to be scheduled Comprehensive Metabolic Panel- advised patient that the nurse will submit order and call back with update to go to lab at [phone].

## 2024-08-18 NOTE — Telephone Encounter (Signed)
 Patient notified

## 2024-08-19 ENCOUNTER — Ambulatory Visit: Admitting: Urology

## 2024-08-19 ENCOUNTER — Ambulatory Visit: Payer: Self-pay | Admitting: Urology

## 2024-08-19 ENCOUNTER — Other Ambulatory Visit
Admission: RE | Admit: 2024-08-19 | Discharge: 2024-08-19 | Disposition: A | Discharge status: Home / Self Care | Attending: Urology | Admitting: Urology

## 2024-08-19 VITALS — BP 128/85 | HR 69 | Wt 181.0 lb

## 2024-08-19 DIAGNOSIS — R399 Unspecified symptoms and signs involving the genitourinary system: Secondary | ICD-10-CM | POA: Diagnosis not present

## 2024-08-19 DIAGNOSIS — R39198 Other difficulties with micturition: Secondary | ICD-10-CM | POA: Insufficient documentation

## 2024-08-19 DIAGNOSIS — N401 Enlarged prostate with lower urinary tract symptoms: Secondary | ICD-10-CM | POA: Diagnosis not present

## 2024-08-19 DIAGNOSIS — N138 Other obstructive and reflux uropathy: Secondary | ICD-10-CM

## 2024-08-19 DIAGNOSIS — N529 Male erectile dysfunction, unspecified: Secondary | ICD-10-CM | POA: Diagnosis not present

## 2024-08-19 LAB — URINALYSIS, COMPLETE (UACMP) WITH MICROSCOPIC
Bacteria, UA: NONE SEEN
Bilirubin Urine: NEGATIVE
Glucose, UA: NEGATIVE mg/dL
Hgb urine dipstick: NEGATIVE
Ketones, ur: NEGATIVE mg/dL
Leukocytes,Ua: NEGATIVE
Nitrite: NEGATIVE
Protein, ur: NEGATIVE mg/dL
Specific Gravity, Urine: 1.015 (ref 1.005–1.030)
Squamous Epithelial / HPF: NONE SEEN /HPF (ref 0–5)
pH: 5.5 (ref 5.0–8.0)

## 2024-08-19 LAB — BLADDER SCAN AMB NON-IMAGING

## 2024-08-19 MED ORDER — TADALAFIL 5 MG PO TABS: 5.0000 mg | ORAL_TABLET | Freq: Every day | ORAL | 11 refills | Status: DC

## 2024-08-19 NOTE — Progress Notes (Signed)
   08/19/24 8:30 AM   Douglas Coffey 1966-05-16 [id number]  CC: Urinary symptoms, ED, PSA screening, CKD  HPI: 58 year old male who reports 6 months of symptoms with weak stream, frequency, urgency, some dribbling, nocturia 1-2 times at night.  He also has difficulty maintaining erections.  He has sleep apnea noncompliant with CPAP, though has lost quite a bit of weight since his initial diagnosis.  He drinks primarily water  during the day.  He was prescribed Cialis  and Flomax  by his PCP, sounds like he only took 1 dose of each.  Urinalysis today completely benign, PVR normal at 18ml.  PSA normal 1.4 and stable from prior.  IPSS score today 18, quality-of-life max.  Renal function has been borderline with long history of creatinine approximately 1.3-1.5 for at least 10 years.   PMH: Past Medical History:  Diagnosis Date   Hyperlipidemia    Sleep apnea    CPAP   Vitamin D  deficiency     Surgical History: Past Surgical History:  Procedure Laterality Date   COLONOSCOPY WITH PROPOFOL  N/A 06/27/2020   Procedure: COLONOSCOPY WITH PROPOFOL ;  Surgeon: Janalyn Keene NOVAK, MD;  Location: [facility];  Service: Endoscopy;  Laterality: N/A;  priority 4 sleep apnea   POLYPECTOMY N/A 06/27/2020   Procedure: POLYPECTOMY;  Surgeon: Janalyn Keene NOVAK, MD;  Location: [facility];  Service: Endoscopy;  Laterality: N/A;   TOOTH EXTRACTION      Family History: Family History  Problem Relation Age of Onset   Diabetes Mother    Healthy Brother    Healthy Daughter     Social History:  reports that he has never smoked. He has never used smokeless tobacco. He reports that he does not drink alcohol and does not use drugs.  Physical Exam: BP 128/85 (BP Location: Left Arm, Patient Position: Sitting, Cuff Size: Normal)   Pulse 69   Wt 181 lb (82.1 kg)   SpO2 98%   BMI 25.24 kg/m    Constitutional:  Alert and oriented, No acute distress. Cardiovascular: No clubbing,  cyanosis, or edema. Respiratory: Normal respiratory effort, no increased work of breathing. GI: Abdomen is soft, nontender, nondistended, no abdominal masses   Laboratory Data: Reviewed, see HPI  Pertinent Imaging: None to review  Assessment & Plan:   58 year old male with 6 months of obstructive urinary symptoms and ED.  Has not really tried medications at this point aside from a single dose of Cialis  and Flomax .  Normal urinalysis, normal PSA, normal PVR, renal function stable.  We discussed possible etiologies including BPH, urethral stricture, pelvic floor dysfunction.  I recommended starting with a trial of Cialis  5 mg daily for both BPH and ED, and can take a boost dose of 10 to 15 mg as needed 1 hour prior to sexual activity.  If he is still having symptoms after 2 to 3 weeks of that, would recommend adding the nightly Flomax  to see if we can improve the urinary symptoms further.  Will schedule follow-up in 4 to 6 weeks for cystoscopy/TRUS, but can change to office visit if symptoms have resolved with the above strategies.  Redell Burnet, MD 08/19/2024  [facility] [address], [address] [place], [place] [address] [phone]

## 2024-08-19 NOTE — Patient Instructions (Addendum)
 Start by taking Cialis  5 mg daily, and you can take an additional 5 to 15 mg 1 hour prior to sexual activity as a boost dose.  The Cialis  can help with both the erections and the urinary symptoms.  Start this for 2 weeks.  If after 2 weeks you are still having problems with urination, recommend adding the Flomax  0.4 mg nightly.  Most people see an improvement within a few days in the urination after adding Flomax .  Rare side effects would be lightheadedness or dizziness.  We will see you back in clinic in 4 to 6 weeks-if your symptoms have resolved on the medications we can continue with medications alone.  However, if you are having bothersome urinary symptoms, we will perform cystoscopy to look in the bladder at that visit, and a transrectal ultrasound to measure your prostate to see what type of surgical options would be available to improve your urinary symptoms.   Understanding BPH (Benign Prostatic Hyperplasia)  What is BPH? BPH stands for Benign Prostatic Hyperplasia. It is a common condition that affects many men as they get older. It happens when the prostate gland, which is part of the male reproductive system, gets bigger. This can cause problems when urinating.  What Are the Symptoms?    Feeling like you need to urinate often, especially at night Trouble starting urinating or weak stream Sudden urge to urinate Feeling that your bladder has not emptied completely  Why Does It Happen? As men age, the prostate naturally gets bigger. This can press against the tube that carries urine out of the bladder, making it hard to urinate.  How Is BPH Treated? There are different ways to treat BPH, depending on how severe the symptoms are:  Lifestyle Changes    Reduce drinks before bedtime Avoid diet/flavored drinks, caffeine, and alcohol Practice double voiding (urinating twice during a trip to the bathroom)  Medications    Alpha-blockers (like tamsulosin /flomax ) to relax the muscles  in the prostate and bladder neck 5-alpha reductase inhibitors (like finasteride) to shrink the prostate  If your symptoms do not respond to medications, you have side effect from medications, or would just like to get off of medications, there are multiple surgical options to improve urination.  Procedures and Surgery(Urolift and HoLEP)    UroLift: ~15-minute outpatient procedure performed in the operating room where the prostate is pinned open with small clips.  Patient's discharge the same day, rarely needed catheter, no risk of urinary leakage or erection problems.  Very low risk of bleeding or infection.  It is common to have some temporary irritative symptoms for 1 to 2 weeks after surgery including burning with urination, urgency, frequency.  This is a good option for small prostate or patients with mild to moderate symptoms, but may not be the best long-term treatment if you have a very large prostate, have urinary retention requiring a catheter, or have severe symptoms  HoLEP(holmium laser enucleation of the prostate): ~1 hour outpatient procedure performed in the operating room where a laser is used to hollow out a large channel through the prostate.  The procedure was performed through the urethra, so there are no cuts or incisions.  You will need a catheter for 2 days afterwards.  Low risk of bleeding, infection, or damage to the bladder or ureters.  This is the gold standard BPH procedure.  Common to have urinary urgency, frequency, and leakage temporarily afterwards, but <2% risk of long-term incontinence.  This is the best treatment  if you have a very large prostate, retention requiring a Foley catheter, or have severe symptoms.  Understanding Erectile Dysfunction (ED)  What is ED? Erectile Dysfunction, or ED, is when a man has trouble getting or keeping an erection enough for sex. It is common and can happen at any age but is more common as men get older.  What Causes ED? ED can  happen for many reasons, including:    Stress or feeling anxious Health problems like diabetes, heart disease, or high blood pressure Lifestyle habits like smoking, drinking too much alcohol, drug use, or not enough exercise Certain medicines(some blood pressure medications, antidepressants, sedatives)  Symptoms of ED:   Difficulty getting an erection Trouble keeping an erection during sex Reduced interest in sex  Treatment Options There are different ways to treat ED. Talk to your doctor to find what's best for you.  Lifestyle Changes   Exercise regularly Eat healthy foods Quit smoking and limit alcohol Weight loss Reduce stress  Medications   Oral medicines like Viagra (sildenafil ) or Cialis (tadalafil ).  These are generic and affordable, the lowest price is at [email or web address].com.  These must be taken about an hour before sexual activity, and still require sexual stimulation to get an erection Side effects can include stuffy nose, headache, muscle pain, or changes in vision There is no risk of heart attack or stroke when medications are taken as directed These medications cannot be taken with nitrates for chest pain  Other Treatments    Penile injections-a medicine is injected directly into the penis to give you an immediate erection Devices like pump vacuum systems that help create an erection Surgery: Penile prosthesis for patients with no improvement with medicines or injections who are still interested in sexual activity.  Visit [email or web address] for more details.  Dr. Lovie is a urologist in [facility] with [facility] who performs penile prosthesis surgeries Counseling or Therapy    If ED is caused by stress, anxiety, or relationship issues, talking to a counselor or therapist can help. Cystoscopy Cystoscopy is a procedure that is used to help diagnose and sometimes treat conditions that affect the lower urinary tract. The lower urinary tract includes the  bladder and the urethra. The urethra is the tube that drains urine from the bladder. Cystoscopy is done using a thin, tube-shaped instrument with a light and camera at the end (cystoscope). The cystoscope may be hard or flexible, depending on the goal of the procedure. The cystoscope is inserted through the urethra, into the bladder. Cystoscopy may be recommended if you have: Urinary tract infections that keep coming back. Blood in the urine (hematuria). An inability to control when you urinate (urinary incontinence) or an overactive bladder. Unusual cells found in a urine sample. A blockage in the urethra, such as a urinary stone. Painful urination. An abnormality in the bladder found during an intravenous pyelogram (IVP) or CT scan. What are the risks? Generally, this is a safe procedure. However, problems may occur, including: Infection. Bleeding.  What happens during the procedure?  You will be given one or more of the following: A medicine to numb the area (local anesthetic). The area around the opening of your urethra will be cleaned. The cystoscope will be passed through your urethra into your bladder. Germ-free (sterile) fluid will flow through the cystoscope to fill your bladder. The fluid will stretch your bladder so that your health care provider can clearly examine your bladder walls. Your doctor will look at the urethra and bladder. The cystoscope will  be removed The procedure may vary among health care providers  What can I expect after the procedure? After the procedure, it is common to have: Some soreness or pain in your urethra. Urinary symptoms. These include: Mild pain or burning when you urinate. Pain should stop within a few minutes after you urinate. This may last for up to a few days after the procedure. A small amount of blood in your urine for several days. Feeling like you need to urinate but producing only a small amount of urine. Follow these instructions at  home: General instructions Return to your normal activities as told by your health care provider.  Drink plenty of fluids after the procedure. Keep all follow-up visits as told by your health care provider. This is important. Contact a health care provider if you: Have pain that gets worse or does not get better with medicine, especially pain when you urinate lasting longer than 72 hours after the procedure. Have trouble urinating. Get help right away if you: Have blood clots in your urine. Have a fever or chills. Are unable to urinate. Summary Cystoscopy is a procedure that is used to help diagnose and sometimes treat conditions that affect the lower urinary tract. Cystoscopy is done using a thin, tube-shaped instrument with a light and camera at the end. After the procedure, it is common to have some soreness or pain in your urethra. It is normal to have blood in your urine after the procedure.  If you were prescribed an antibiotic medicine, take it as told by your health care provider.  This information is not intended to replace advice given to you by your health care provider. Make sure you discuss any questions you have with your health care provider. Document Revised: 11/10/2018 Document Reviewed: 11/10/2018 Elsevier Patient Education  2020 [employer].

## 2024-08-20 LAB — CMP14+EGFR
ALT: 22 IU/L (ref 0–44)
AST: 22 IU/L (ref 0–40)
Albumin: 4.6 g/dL (ref 3.8–4.9)
Alkaline Phosphatase: 90 IU/L (ref 47–123)
BUN/Creatinine Ratio: 9 (ref 9–20)
BUN: 12 mg/dL (ref 6–24)
Bilirubin Total: 1.8 mg/dL — ABNORMAL HIGH (ref 0.0–1.2)
CO2: 24 mmol/L (ref 20–29)
Calcium: 9.3 mg/dL (ref 8.7–10.2)
Chloride: 101 mmol/L (ref 96–106)
Creatinine, Ser: 1.4 mg/dL — ABNORMAL HIGH (ref 0.76–1.27)
Globulin, Total: 2.1 g/dL (ref 1.5–4.5)
Glucose: 97 mg/dL (ref 70–99)
Potassium: 4.7 mmol/L (ref 3.5–5.2)
Sodium: 140 mmol/L (ref 134–144)
Total Protein: 6.7 g/dL (ref 6.0–8.5)
eGFR: 59 mL/min/1.73 — ABNORMAL LOW (ref >59)

## 2024-08-23 ENCOUNTER — Ambulatory Visit: Payer: Self-pay | Admitting: Family Medicine

## 2024-08-24 NOTE — Progress Notes (Signed)
 Seen by patient Douglas Coffey on 08/23/2024  8:20 AM

## 2024-08-25 ENCOUNTER — Ambulatory Visit: Admitting: Family Medicine

## 2024-08-25 ENCOUNTER — Encounter: Payer: Self-pay | Admitting: Family Medicine

## 2024-08-25 VITALS — BP 110/75 | HR 79 | Temp 97.3°F | Ht 71.0 in | Wt 184.5 lb

## 2024-08-25 DIAGNOSIS — Z23 Encounter for immunization: Secondary | ICD-10-CM

## 2024-08-25 DIAGNOSIS — N182 Chronic kidney disease, stage 2 (mild): Secondary | ICD-10-CM

## 2024-08-25 DIAGNOSIS — G250 Essential tremor: Secondary | ICD-10-CM

## 2024-08-25 DIAGNOSIS — E7800 Pure hypercholesterolemia, unspecified: Secondary | ICD-10-CM | POA: Diagnosis not present

## 2024-08-25 DIAGNOSIS — E663 Overweight: Secondary | ICD-10-CM

## 2024-08-25 DIAGNOSIS — N401 Enlarged prostate with lower urinary tract symptoms: Secondary | ICD-10-CM

## 2024-08-25 DIAGNOSIS — R350 Frequency of micturition: Secondary | ICD-10-CM

## 2024-08-25 DIAGNOSIS — N1831 Chronic kidney disease, stage 3a: Secondary | ICD-10-CM

## 2024-08-25 NOTE — Assessment & Plan Note (Signed)
 Chronic  Intermittent  Continue propranolol  20mg  BID

## 2024-08-25 NOTE — Progress Notes (Signed)
 Established patient visit   Patient: Douglas Coffey   DOB: [birth year]   58 y.o. Male  MRN: [id number] Visit Date: 08/25/2024  Today's healthcare provider: Rockie Agent, MD   Chief Complaint  Patient presents with   Follow-up    -Patient is here to follow up on his kidney function -Also wants labs for lipids to see how the medication is working  -2nd shingles shot  Declined all vaccines except shingles.   Subjective     HPI     Follow-up    Additional comments: -Patient is here to follow up on his kidney function -Also wants labs for lipids to see how the medication is working  -2nd shingles shot  Declined all vaccines except shingles.      Last edited by Terrel Powell CROME, CMA on 08/25/2024  8:44 AM.       Discussed the use of AI scribe software for clinical note transcription with the patient, who gave verbal consent to proceed.  History of Present Illness Douglas Coffey is a 58 year old male with hypercholesterolemia and chronic kidney disease who presents for follow-up on recent labs for renal function and cholesterol management.  He is receiving his second dose of the Shingrix  vaccine. His last lipid panel was two months ago, and he is currently on Lipitor 20 mg daily, which he started in July. He is concerned about the impact of Lipitor on his GFR, which has decreased to 59, and his bilirubin, which is elevated at 1.8.  He has a history of obstructive sleep apnea and is overweight with a BMI of 25.73. He is on propranolol  20 mg twice daily for essential tremor, Cialis  5 mg daily for erectile dysfunction, and Flomax  0.4 mg daily for benign prostatic hyperplasia. His creatinine levels have fluctuated over the years, with the highest being 1.48 in 2016 and the most recent at 1.4, corresponding to a GFR of 59.  He notes a recent increase in his glucose levels from 79 to 97, which he attributes to the Lipitor. He has made significant dietary changes,  reducing sodium intake and switching to healthier options like wheat thins and unsweetened almond milk. He has also increased his physical activity, which he believes has contributed to his sodium levels returning to normal range.  He has a family history of diabetes, with his mother being diabetic and other family members experiencing amputations due to diabetes. He is concerned about his glucose levels due to this family history. He has been monitoring his blood pressure, which has been stable. He is cautious about starting new medications and prefers to monitor his condition with lifestyle changes first.     Past Medical History:  Diagnosis Date   Hyperlipidemia    Sleep apnea    CPAP   Vitamin D  deficiency     Medications: Outpatient Medications Prior to Visit  Medication Sig   atorvastatin  (LIPITOR) 20 MG tablet Take 1 tablet (20 mg total) by mouth daily.   cetirizine (ZYRTEC) 10 MG tablet Take 10 mg by mouth daily.   CINNAMON PO Take by mouth daily. With Chromium   Multiple Vitamin (MULTIVITAMIN) tablet Take 1 tablet by mouth daily.   Omega-3 Fatty Acids (FISH OIL) 1200 MG CAPS Take 2 capsules by mouth daily.   OVER THE COUNTER MEDICATION daily. Beets   propranolol  (INDERAL ) 20 MG tablet Take 1 tablet (20 mg total) by mouth 2 (two) times daily.   psyllium (METAMUCIL) 58.6 % powder Take 1  packet by mouth 2 (two) times daily.   tadalafil  (CIALIS ) 5 MG tablet Take 1 tablet (5 mg total) by mouth daily. Okay to take additional 5 to 15 mg 1 hour prior to sexual activity as boost dose   tamsulosin  (FLOMAX ) 0.4 MG CAPS capsule Take 1 capsule (0.4 mg total) by mouth daily.   No facility-administered medications prior to visit.    Review of Systems  Last metabolic panel Lab Results  Component Value Date   GLUCOSE 97 08/19/2024   NA 140 08/19/2024   K 4.7 08/19/2024   CL 101 08/19/2024   CO2 24 08/19/2024   BUN 12 08/19/2024   CREATININE 1.40 (H) 08/19/2024   EGFR 59 (L)  08/19/2024   CALCIUM  9.3 08/19/2024   PROT 6.7 08/19/2024   ALBUMIN 4.6 08/19/2024   LABGLOB 2.1 08/19/2024   AGRATIO 1.9 05/19/2020   BILITOT 1.8 (H) 08/19/2024   ALKPHOS 90 08/19/2024   AST 22 08/19/2024   ALT 22 08/19/2024   Last lipids Lab Results  Component Value Date   CHOL 213 (H) 06/23/2024   HDL 48 06/23/2024   LDLCALC 142 (H) 06/23/2024   TRIG 126 06/23/2024   CHOLHDL 4.4 06/23/2024   The 10-year ASCVD risk score (Arnett DK, et al., 2019) is: 5.5%  Last hemoglobin A1c Lab Results  Component Value Date   HGBA1C 5.1 06/23/2024   Last thyroid functions Lab Results  Component Value Date   TSH 3.230 05/19/2023     {See past labs  Heme  Chem  Endocrine  Serology  Results Review (optional):1}   Objective    BP 110/75 (BP Location: Left Arm, Patient Position: Sitting, Cuff Size: Normal)   Pulse 79   Temp (!) 97.3 F (36.3 C) (Oral)   Ht 5' 11 (1.803 m)   Wt 184 lb 8 oz (83.7 kg)   SpO2 100%   BMI 25.73 kg/m  BP Readings from Last 3 Encounters:  08/25/24 110/75  08/19/24 128/85  08/03/24 124/85   Wt Readings from Last 3 Encounters:  08/25/24 184 lb 8 oz (83.7 kg)  08/19/24 181 lb (82.1 kg)  08/03/24 185 lb 14.4 oz (84.3 kg)    {See vitals history (optional):1}    Physical Exam Vitals reviewed.  Constitutional:      General: He is not in acute distress.    Appearance: Normal appearance. He is not ill-appearing.  Cardiovascular:     Rate and Rhythm: Normal rate and regular rhythm.  Pulmonary:     Effort: Pulmonary effort is normal. No respiratory distress.     Breath sounds: No wheezing, rhonchi or rales.  Neurological:     Mental Status: He is alert and oriented to person, place, and time.  Psychiatric:        Mood and Affect: Mood normal.        Behavior: Behavior normal.       No results found for any visits on 08/25/24.  Assessment & Plan     Problem List Items Addressed This Visit     Benign prostatic hyperplasia with  urinary frequency   Essential tremor   OSA (obstructive sleep apnea)   Overweight (BMI 25.0-29.9)   Pure hypercholesterolemia   Relevant Orders   Lipid Panel With LDL/HDL Ratio   Vitamin D  deficiency   Other Visit Diagnoses       Stage 3a chronic kidney disease (HCC)    -  Primary   Relevant Orders   CMP14+EGFR   US  Renal  Hyperbilirubinemia       Relevant Orders   CMP14+EGFR   US  Abdomen Limited RUQ (LIVER/GB)     Need for shingles vaccine       Relevant Orders   Zoster Recombinant (Shingrix  ) (Completed)        Assessment & Plan Chronic kidney disease stage 3a Chronic kidney disease stage 3a with a GFR of 59 and fluctuating creatinine levels, most recently 1.4. Concern about Lipitor's impact on kidney function, though it typically affects liver function. Emphasized protective measures for kidney function, including potential medications like losartan and Farxiga, and advised on healthy lifestyle choices. - Recheck kidney function with labs today - Order abdominal ultrasound to assess kidney structure - Consider starting losartan or Farxiga based on lab results - Provide handouts on chronic kidney disease and prevention     General Health Maintenance Receiving second dose of Shingrix  vaccine today. - Administered second dose of Shingrix  vaccine    Return in about 3 months (around 11/24/2024) for CKD f/u , Cholesterol.       Rockie Agent, MD  [facility] [phone] (phone) [phone] (fax)  [facility]

## 2024-08-25 NOTE — Patient Instructions (Signed)
 To keep you healthy, please keep in mind the following health maintenance items that you are due for:   Health Maintenance Due  Topic Date Due   Pneumococcal Vaccine: 50+ Years (1 of 2 - PCV) Never done   Influenza Vaccine  Never done   Hepatitis B Vaccines 19-59 Average Risk (2 of 2 - CpG 2-dose series) 07/21/2024   COVID-19 Vaccine (5 - 2025-26 season) 08/02/2024   Zoster Vaccines- Shingrix  (2 of 2) 08/18/2024     Best Wishes,   Dr. Lang

## 2024-08-25 NOTE — Assessment & Plan Note (Signed)
 Hypercholesterolemia Chronic  Hypercholesterolemia with previous LDL of 144, currently on Lipitor 20 mg daily. Significant dietary changes and increased physical activity are expected to reduce LDL significantly. Goal is to reduce LDL to below 100, ideally under 70. Concern about Lipitor's impact on bilirubin and glucose levels, but not concerning at this time. - Recheck lipid panel today - Consider reducing Lipitor to 10 from 20 mg if LDL is significantly improved - Provide handouts on cholesterol management

## 2024-08-25 NOTE — Progress Notes (Unsigned)
shin

## 2024-08-25 NOTE — Assessment & Plan Note (Signed)
  Benign prostatic hyperplasia with lower urinary tract symptoms and erectile dysfunction Benign prostatic hyperplasia with lower urinary tract symptoms and erectile dysfunction, managed with Cialis  5 mg daily. Option to add Flomax  if symptoms do not improve. Under urologist care with follow-up scheduled. Potential need for cystoscopy if symptoms persist. - Continue Cialis  5 mg daily - Add Flomax  0.4mg  daily  if symptoms do not improve per urology recommendations  - Follow up with urologist on October 29 - Consider cystoscopy if symptoms persist

## 2024-08-25 NOTE — Assessment & Plan Note (Signed)
 Chronic  Improved with dietary changes and increased exercise  Encouraged patient to continue lifestyle management

## 2024-08-26 LAB — LIPID PANEL WITH LDL/HDL RATIO
Cholesterol, Total: 122 mg/dL (ref 100–199)
HDL: 54 mg/dL (ref >39)
LDL Chol Calc (NIH): 54 mg/dL (ref 0–99)
LDL/HDL Ratio: 1 ratio (ref 0.0–3.6)
Triglycerides: 65 mg/dL (ref 0–149)
VLDL Cholesterol Cal: 14 mg/dL (ref 5–40)

## 2024-08-26 LAB — CMP14+EGFR
ALT: 18 IU/L (ref 0–44)
AST: 19 IU/L (ref 0–40)
Albumin: 4.7 g/dL (ref 3.8–4.9)
Alkaline Phosphatase: 93 IU/L (ref 47–123)
BUN/Creatinine Ratio: 12 (ref 9–20)
BUN: 16 mg/dL (ref 6–24)
Bilirubin Total: 1.8 mg/dL — ABNORMAL HIGH (ref 0.0–1.2)
CO2: 24 mmol/L (ref 20–29)
Calcium: 9.5 mg/dL (ref 8.7–10.2)
Chloride: 100 mmol/L (ref 96–106)
Creatinine, Ser: 1.34 mg/dL — ABNORMAL HIGH (ref 0.76–1.27)
Globulin, Total: 2.1 g/dL (ref 1.5–4.5)
Glucose: 90 mg/dL (ref 70–99)
Potassium: 4.8 mmol/L (ref 3.5–5.2)
Sodium: 140 mmol/L (ref 134–144)
Total Protein: 6.8 g/dL (ref 6.0–8.5)
eGFR: 62 mL/min/1.73 (ref >59)

## 2024-08-27 ENCOUNTER — Ambulatory Visit: Payer: Self-pay | Admitting: Family Medicine

## 2024-08-27 DIAGNOSIS — N182 Chronic kidney disease, stage 2 (mild): Secondary | ICD-10-CM | POA: Insufficient documentation

## 2024-08-27 NOTE — Assessment & Plan Note (Addendum)
 Chronic kidney disease stage 3a>>>improved to CKD stage 2 after labs were updated   GFR of 59 and fluctuating creatinine levels. We discussed his recent change in kidney function was unlikely related to starting Lipitor. Emphasized protective measures for kidney function, including potential medications like losartan and Farxiga, and advised on healthy lifestyle choices. - Recheck kidney function with labs today including CMP - Order renal ultrasound to assess kidney structure - recommended starting renal protective medications specifically losartan or Farxiga, pt voiced understanding but would prefer to hold off on additional medications at this time - Provide handouts on chronic kidney disease and prevention

## 2024-09-03 ENCOUNTER — Ambulatory Visit
Admission: RE | Admit: 2024-09-03 | Discharge: 2024-09-03 | Disposition: A | Discharge status: Home / Self Care | Source: Ambulatory Visit | Attending: Family Medicine | Admitting: Family Medicine

## 2024-09-03 DIAGNOSIS — N182 Chronic kidney disease, stage 2 (mild): Secondary | ICD-10-CM | POA: Diagnosis present

## 2024-09-29 ENCOUNTER — Ambulatory Visit (INDEPENDENT_AMBULATORY_CARE_PROVIDER_SITE_OTHER): Admitting: Urology

## 2024-09-29 VITALS — BP 136/87 | HR 81 | Ht 71.0 in | Wt 181.0 lb

## 2024-09-29 DIAGNOSIS — N529 Male erectile dysfunction, unspecified: Secondary | ICD-10-CM | POA: Diagnosis not present

## 2024-09-29 DIAGNOSIS — R3129 Other microscopic hematuria: Secondary | ICD-10-CM

## 2024-09-29 DIAGNOSIS — N138 Other obstructive and reflux uropathy: Secondary | ICD-10-CM

## 2024-09-29 DIAGNOSIS — N401 Enlarged prostate with lower urinary tract symptoms: Secondary | ICD-10-CM

## 2024-09-29 DIAGNOSIS — R3912 Poor urinary stream: Secondary | ICD-10-CM

## 2024-09-29 MED ORDER — TADALAFIL 5 MG PO TABS: 5.0000 mg | ORAL_TABLET | Freq: Every day | ORAL | 11 refills | Status: AC

## 2024-09-29 MED ORDER — TAMSULOSIN HCL 0.4 MG PO CAPS: 0.4000 mg | ORAL_CAPSULE | Freq: Every day | ORAL | 3 refills | Status: AC

## 2024-09-29 MED ORDER — LIDOCAINE HCL URETHRAL/MUCOSAL 2 % EX GEL
1.0000 | Freq: Once | CUTANEOUS | Status: AC
  Administered 2024-09-29: 1 via URETHRAL

## 2024-09-29 NOTE — Progress Notes (Signed)
 Cystoscopy Procedure Note:  Indication: BPH with obstruction  After informed consent and discussion of the procedure and its risks, Douglas Coffey was positioned and prepped in the standard fashion. Cystoscopy was performed with a flexible cystoscope. The urethra, bladder neck and entire bladder was visualized in a standard fashion. The prostate was tight with some slight bladder neck elevation. The ureteral orifices were visualized in their normal location and orientation.  Bladder mucosa grossly normal throughout, no abnormalities on retroflexion.  Imaging: Transrectal ultrasound was inserted into the rectum and measurements taken with prostate with 3.2 cm, height 4.8 cm, length 4.3 cm, for calculated prostate volume of 35g  Findings: BPH, 35g prostate  --------------------------------------------------------------  Assessment and Plan: 58 year old male with obstructive urinary symptoms, weak stream, dribbling.  Currently on daily Cialis  and Flomax  with significant improvement in his symptoms.  We discussed other alternatives like increasing Flomax  dose, UroLift, or HoLEP.  He prefers to stick with the Flomax  at this time, return precautions were discussed.  PSA normal at 1.4, urinalysis benign.  Continue Flomax  and Cialis  daily RTC 6 months IPSS, PVR  Redell Burnet, MD 09/29/2024

## 2024-09-29 NOTE — Patient Instructions (Signed)
 Understanding BPH (Benign Prostatic Hyperplasia)  What is BPH? BPH stands for Benign Prostatic Hyperplasia. It is a common condition that affects many men as they get older. It happens when the prostate gland, which is part of the male reproductive system, gets bigger. This can cause problems when urinating.  What Are the Symptoms?    Feeling like you need to urinate often, especially at night Trouble starting urinating or weak stream Sudden urge to urinate Feeling that your bladder has not emptied completely  Why Does It Happen? As men age, the prostate naturally gets bigger. This can press against the tube that carries urine out of the bladder, making it hard to urinate.  How Is BPH Treated? There are different ways to treat BPH, depending on how severe the symptoms are:  Lifestyle Changes    Reduce drinks before bedtime Avoid diet/flavored drinks, caffeine, and alcohol Practice double voiding (urinating twice during a trip to the bathroom)  Medications    Alpha-blockers (like tamsulosin /flomax ) to relax the muscles in the prostate and bladder neck 5-alpha reductase inhibitors (like finasteride) to shrink the prostate  If your symptoms do not respond to medications, you have side effect from medications, or would just like to get off of medications, there are multiple surgical options to improve urination.  Procedures and Surgery(Urolift and HoLEP)    UroLift: ~15-minute outpatient procedure performed in the operating room where the prostate is pinned open with small clips.  Patient's discharge the same day, rarely needed catheter, no risk of urinary leakage or erection problems.  Very low risk of bleeding or infection.  It is common to have some temporary irritative symptoms for 1 to 2 weeks after surgery including burning with urination, urgency, frequency.  This is a good option for small prostate or patients with mild to moderate symptoms, but may not be the best long-term  treatment if you have a very large prostate, have urinary retention requiring a catheter, or have severe symptoms  HoLEP(holmium laser enucleation of the prostate): ~1 hour outpatient procedure performed in the operating room where a laser is used to hollow out a large channel through the prostate.  The procedure was performed through the urethra, so there are no cuts or incisions.  You will need a catheter for 2 days afterwards.  Low risk of bleeding, infection, or damage to the bladder or ureters.  This is the gold standard BPH procedure.  Common to have urinary urgency, frequency, and leakage temporarily afterwards, but <2% risk of long-term incontinence.  This is the best treatment if you have a very large prostate, retention requiring a Foley catheter, or have severe symptoms.

## 2024-11-10 ENCOUNTER — Encounter: Payer: Self-pay | Admitting: Family Medicine

## 2024-11-10 DIAGNOSIS — N182 Chronic kidney disease, stage 2 (mild): Secondary | ICD-10-CM

## 2024-11-10 DIAGNOSIS — N529 Male erectile dysfunction, unspecified: Secondary | ICD-10-CM

## 2024-11-13 LAB — COMPREHENSIVE METABOLIC PANEL WITH GFR
ALT: 18 IU/L (ref 0–44)
AST: 20 IU/L (ref 0–40)
Albumin: 4.6 g/dL (ref 3.8–4.9)
Alkaline Phosphatase: 87 IU/L (ref 47–123)
BUN/Creatinine Ratio: 10 (ref 9–20)
BUN: 13 mg/dL (ref 6–24)
Bilirubin Total: 1.9 mg/dL — ABNORMAL HIGH (ref 0.0–1.2)
CO2: 26 mmol/L (ref 20–29)
Calcium: 9.3 mg/dL (ref 8.7–10.2)
Chloride: 100 mmol/L (ref 96–106)
Creatinine, Ser: 1.27 mg/dL (ref 0.76–1.27)
Globulin, Total: 2 g/dL (ref 1.5–4.5)
Glucose: 94 mg/dL (ref 70–99)
Potassium: 4.5 mmol/L (ref 3.5–5.2)
Sodium: 142 mmol/L (ref 134–144)
Total Protein: 6.6 g/dL (ref 6.0–8.5)
eGFR: 66 mL/min/1.73 (ref >59)

## 2024-11-13 LAB — CK: Total CK: 181 U/L (ref 41–331)

## 2024-11-13 LAB — PSA TOTAL (REFLEX TO FREE): Prostate Specific Ag, Serum: 1.3 ng/mL (ref 0.0–4.0)

## 2024-11-17 ENCOUNTER — Ambulatory Visit: Payer: Self-pay | Admitting: Family Medicine

## 2024-12-07 ENCOUNTER — Encounter: Payer: Self-pay | Admitting: Family Medicine

## 2024-12-07 ENCOUNTER — Ambulatory Visit: Admitting: Family Medicine

## 2024-12-07 VITALS — BP 124/83 | HR 70 | Ht 71.0 in | Wt 180.4 lb

## 2024-12-07 DIAGNOSIS — G250 Essential tremor: Secondary | ICD-10-CM

## 2024-12-07 DIAGNOSIS — N401 Enlarged prostate with lower urinary tract symptoms: Secondary | ICD-10-CM

## 2024-12-07 DIAGNOSIS — R17 Unspecified jaundice: Secondary | ICD-10-CM | POA: Insufficient documentation

## 2024-12-07 DIAGNOSIS — R82998 Other abnormal findings in urine: Secondary | ICD-10-CM | POA: Diagnosis not present

## 2024-12-07 DIAGNOSIS — N529 Male erectile dysfunction, unspecified: Secondary | ICD-10-CM

## 2024-12-07 DIAGNOSIS — G4733 Obstructive sleep apnea (adult) (pediatric): Secondary | ICD-10-CM

## 2024-12-07 DIAGNOSIS — L299 Pruritus, unspecified: Secondary | ICD-10-CM

## 2024-12-07 DIAGNOSIS — E559 Vitamin D deficiency, unspecified: Secondary | ICD-10-CM | POA: Diagnosis not present

## 2024-12-07 DIAGNOSIS — R350 Frequency of micturition: Secondary | ICD-10-CM | POA: Diagnosis not present

## 2024-12-07 DIAGNOSIS — N182 Chronic kidney disease, stage 2 (mild): Secondary | ICD-10-CM | POA: Diagnosis not present

## 2024-12-07 DIAGNOSIS — E7800 Pure hypercholesterolemia, unspecified: Secondary | ICD-10-CM

## 2024-12-07 MED ORDER — SILDENAFIL CITRATE 100 MG PO TABS: 50.0000 mg | ORAL_TABLET | Freq: Every day | ORAL | 11 refills | Status: AC | PRN

## 2024-12-07 NOTE — Assessment & Plan Note (Signed)
 Vitamin D  deficiency Chronic vitamin D  deficiency. Last measured level was 30.6, within normal range. - Recommended over-the-counter vitamin D  supplement 2000 IU daily

## 2024-12-07 NOTE — Assessment & Plan Note (Signed)
 New issue  Bili elevated at 1.9 in Dec 2025 Will check LDH, CBC, fractionated bilirubin, reticulocytes and bile acids for pruritus

## 2024-12-07 NOTE — Assessment & Plan Note (Addendum)
 Chronic  Lipid panel ordered today  Will have him hold atorvastatin  20mg  daily for 2 weeks to see if the pruritus resolves  If no change in symptoms, will decrease his lipitor dose to 10mg  daily  If improved without statin, recommend replacing statin with Zetia 10mg  daily  Pt will send a message via MyChart with updates  F/u in 3 months

## 2024-12-07 NOTE — Assessment & Plan Note (Signed)
 Chronic  Intermittent  Continue propranolol  20mg  BID

## 2024-12-07 NOTE — Assessment & Plan Note (Signed)
 Chronic kidney disease, stage 2 Chronic kidney disease stage 2 with improved kidney function. Creatinine decreased to 1.27 and GFR increased to 66. No nephrology referral as he has not seen a nephrologist before. - Continue dietary modifications to reduce sodium intake - Ordered urine albumin to assess for proteinuria

## 2024-12-07 NOTE — Assessment & Plan Note (Signed)
 Chronic  Not currently using CPAP machine but reports improved symptoms since changing his diet and losing weight

## 2024-12-07 NOTE — Assessment & Plan Note (Signed)
" °  Chronic Benign prostatic hyperplasia with lower urinary tract symptoms and erectile dysfunction Benign prostatic hyperplasia with urinary frequency and erectile dysfunction. Currently on Flomax  and Cialis . Reports improvement in symptoms with weight loss and dietary changes. Erectile dysfunction managed with Cialis  and Viagra . - Continue Flomax  0.4 mg daily - Continue Cialis  5 mg daily "

## 2024-12-07 NOTE — Patient Instructions (Addendum)
" °  Please let me know via MyChart if the itching stops after holding the Atorvastatin  for 2 weeks and we will start the Zetia 10mg  instead of Lipitor.   If the itching does not change with holding the Lipitor for 2 weeks, we will reduce the dose of the cholesterol medication.   To keep you healthy, please keep in mind the following health maintenance items that you are due for:   Health Maintenance Due  Topic Date Due   Pneumococcal Vaccine: 50+ Years (1 of 1 - PCV) Never done   Hepatitis B Vaccines 19-59 Average Risk (2 of 2 - CpG 2-dose series) 07/21/2024   COVID-19 Vaccine (5 - 2025-26 season) 08/02/2024     Best Wishes,   Dr. Lang  "

## 2024-12-07 NOTE — Progress Notes (Addendum)
 "  Established Patient Office Visit  Patient ID: Douglas Coffey, male    DOB: 01-12-1966  Age: 59 y.o. MRN: [id number] PCP: Sharma Coyer, MD  Chief Complaint  Patient presents with   Medical Management of Chronic Issues    3 month follow-up Patient reports that he has been itching all over not sure if it is because Bilirubin levels are high.    Subjective:     HPI  Discussed the use of AI scribe software for clinical note transcription with the patient, who gave verbal consent to proceed.  History of Present Illness Douglas Coffey is a 59 year old male with chronic kidney disease stage two and hypercholesterolemia who presents for follow-up.  He is following up for chronic kidney disease stage two. Recent lab results show a creatinine level of 1.27 and a GFR of 66. He has been actively managing his condition by joining a CKD support group and making significant dietary changes, such as reducing sodium intake and increasing plant-based proteins. He has lost weight and no longer uses a CPAP machine for his obstructive sleep apnea, as his wife notes he no longer snores and sleeps more peacefully. He reports frothy urine but denies current back pain.  He is managing hypercholesterolemia and has been on atorvastatin  20 mg daily. Since starting atorvastatin , he has experienced generalized pruritus and noticed occasional yellowing of the eyes. He is concerned about elevated bilirubin levels, which have been trending upwards. His liver enzymes were normal in December 2025, with AST at 20 and ALT at 18.  He has a history of chronic benign prostatic hyperplasia with urinary frequency and erectile dysfunction. He was started on Flomax  0.4 mg and Cialis  5 mg daily, which have been managing his urinary symptoms. He is requesting a refill of Viagra  for ED and reports improved symptoms with this medication. He has not seen a nephrologist but has been referred to urology for his BPH  symptoms.  He has a history of chronic essential tremor and continues to take propranolol  20 mg twice daily. He has opted to hold off on additional medications for this condition.  He has a history of chronic vitamin D  deficiency, with the last measurement six months ago showing normal levels at 30.6. He takes a 2000 unit vitamin D  supplement daily. He has also made significant lifestyle changes, including reducing red meat consumption and increasing physical activity, which he believes have contributed to his improved health.     ROS    Objective:     BP 124/83 (BP Location: Right Arm, Patient Position: Sitting, Cuff Size: Normal)   Pulse 70   Ht 5' 11 (1.803 m)   Wt 180 lb 6.4 oz (81.8 kg)   SpO2 100%   BMI 25.16 kg/m  BP Readings from Last 3 Encounters:  12/07/24 124/83  09/29/24 136/87  08/25/24 110/75   Wt Readings from Last 3 Encounters:  12/07/24 180 lb 6.4 oz (81.8 kg)  09/29/24 181 lb (82.1 kg)  08/25/24 184 lb 8 oz (83.7 kg)      Physical Exam Vitals reviewed.  Constitutional:      General: He is not in acute distress.    Appearance: Normal appearance. He is not ill-appearing.  Cardiovascular:     Rate and Rhythm: Normal rate and regular rhythm.  Pulmonary:     Effort: Pulmonary effort is normal. No respiratory distress.     Breath sounds: No wheezing, rhonchi or rales.  Musculoskeletal:     Right lower  leg: No edema.     Left lower leg: No edema.  Neurological:     Mental Status: He is alert and oriented to person, place, and time.  Psychiatric:        Mood and Affect: Mood normal.        Behavior: Behavior normal.      No results found for any visits on 12/07/24.  Last metabolic panel Lab Results  Component Value Date   GLUCOSE 94 11/12/2024   NA 142 11/12/2024   K 4.5 11/12/2024   CL 100 11/12/2024   CO2 26 11/12/2024   BUN 13 11/12/2024   CREATININE 1.27 11/12/2024   EGFR 66 11/12/2024   CALCIUM  9.3 11/12/2024   PROT 6.6 11/12/2024    ALBUMIN 4.6 11/12/2024   LABGLOB 2.0 11/12/2024   AGRATIO 1.9 05/19/2020   BILITOT 1.9 (H) 11/12/2024   ALKPHOS 87 11/12/2024   AST 20 11/12/2024   ALT 18 11/12/2024   Last lipids Lab Results  Component Value Date   CHOL 122 08/25/2024   HDL 54 08/25/2024   LDLCALC 54 08/25/2024   TRIG 65 08/25/2024   CHOLHDL 4.4 06/23/2024   The ASCVD Risk score (Arnett DK, et Coffey., 2019) failed to calculate for the following reasons:   The valid total cholesterol range is 130 to 320 mg/dL  Last hemoglobin J8r Lab Results  Component Value Date   HGBA1C 5.1 06/23/2024   Last thyroid functions Lab Results  Component Value Date   TSH 3.230 05/19/2023   FREET4 1.26 05/19/2023     Last vitamin D  Lab Results  Component Value Date   VD25OH 30.6 06/23/2024      Assessment & Plan:   Problem List Items Addressed This Visit     Benign prostatic hyperplasia with urinary frequency    Chronic Benign prostatic hyperplasia with lower urinary tract symptoms and erectile dysfunction Benign prostatic hyperplasia with urinary frequency and erectile dysfunction. Currently on Flomax  and Cialis . Reports improvement in symptoms with weight loss and dietary changes. Erectile dysfunction managed with Cialis  and Viagra . 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 Creatinine decreased to 1.27 and GFR increased to 66. No nephrology referral as he has not seen a nephrologist before. - Continue dietary modifications to reduce sodium intake - Ordered urine albumin to assess for proteinuria      Elevated bilirubin   New issue  Bili elevated at 1.9 in Dec 2025 Will check LDH, CBC, fractionated bilirubin, reticulocytes and bile acids for pruritus       Relevant Orders   Bilirubin, fractionated (tot/dir/indir)   CBC    Reticulocytes   Lactate Dehydrogenase (LDH)   Erectile dysfunction   Chronic  - Prescribed Viagra  50-100mg  PRN with instructions to break tablets in half for use as needed      Relevant Medications   sildenafil  (VIAGRA ) 100 MG tablet   Essential tremor   Chronic  Intermittent  Continue propranolol  20mg  BID       OSA (obstructive sleep apnea)   Chronic  Not currently using CPAP machine but reports improved symptoms since changing his diet and losing weight        Pure hypercholesterolemia   Chronic  Lipid panel ordered today  Will have him hold atorvastatin  20mg  daily for 2  weeks to see if the pruritus resolves  If no change in symptoms, will decrease his lipitor dose to 10mg  daily  If improved without statin, recommend replacing statin with Zetia 10mg  daily  Pt will send a message via MyChart with updates  F/u in 3 months       Relevant Medications   sildenafil  (VIAGRA ) 100 MG tablet   Other Relevant Orders   Lipid panel   Vitamin D  deficiency   Vitamin D  deficiency Chronic vitamin D  deficiency. Last measured level was 30.6, within normal range. - Recommended over-the-counter vitamin D  supplement 2000 IU daily      Other Visit Diagnoses       Pruritus       Relevant Orders   Bilirubin, fractionated (tot/dir/indir)   Bile acids, total     Frothy urine       Relevant Orders   Microalbumin / creatinine urine ratio        Assessment & Plan       Return in about 3 months (around 03/07/2025) for CKD2, Cholesterol.    Rockie Agent, MD [facility]   "

## 2024-12-07 NOTE — Assessment & Plan Note (Signed)
 Chronic  - Prescribed Viagra  50-100mg  PRN with instructions to break tablets in half for use as needed

## 2024-12-09 ENCOUNTER — Ambulatory Visit: Payer: Self-pay | Admitting: Family Medicine

## 2024-12-09 LAB — CBC
Hematocrit: 47 % (ref 37.5–51.0)
Hemoglobin: 15.3 g/dL (ref 13.0–17.7)
MCH: 30 pg (ref 26.6–33.0)
MCHC: 32.6 g/dL (ref 31.5–35.7)
MCV: 92 fL (ref 79–97)
Platelets: 178 x10E3/uL (ref 150–450)
RBC: 5.1 x10E6/uL (ref 4.14–5.80)
RDW: 12 % (ref 11.6–15.4)
WBC: 3.8 x10E3/uL (ref 3.4–10.8)

## 2024-12-09 LAB — BILIRUBIN, FRACTIONATED(TOT/DIR/INDIR)
Bilirubin Total: 1.6 mg/dL — ABNORMAL HIGH (ref 0.0–1.2)
Bilirubin, Direct: 0.4 mg/dL (ref 0.00–0.40)
Bilirubin, Indirect: 1.2 mg/dL — ABNORMAL HIGH (ref 0.10–0.80)

## 2024-12-09 LAB — LIPID PANEL
Chol/HDL Ratio: 2.2 ratio (ref 0.0–5.0)
Cholesterol, Total: 127 mg/dL (ref 100–199)
HDL: 57 mg/dL
LDL Chol Calc (NIH): 58 mg/dL (ref 0–99)
Triglycerides: 53 mg/dL (ref 0–149)
VLDL Cholesterol Cal: 12 mg/dL (ref 5–40)

## 2024-12-09 LAB — MICROALBUMIN / CREATININE URINE RATIO
Creatinine, Urine: 172.4 mg/dL
Microalb/Creat Ratio: 12 mg/g{creat} (ref 0–29)
Microalbumin, Urine: 21.3 ug/mL

## 2024-12-09 LAB — LACTATE DEHYDROGENASE: LDH: 166 IU/L (ref 121–224)

## 2024-12-09 LAB — BILE ACIDS, TOTAL: Bile Acids Total: 3.8 umol/L (ref 0.0–10.0)

## 2025-03-02 ENCOUNTER — Ambulatory Visit: Admitting: Urology

## 2025-03-08 ENCOUNTER — Ambulatory Visit: Admitting: Family Medicine
